# Patient Record
Sex: Male | Born: 1990 | Race: White | Hispanic: No | Marital: Single | Smoking: Current every day smoker
Health system: Southern US, Community
[De-identification: ages and names within clinical notes are randomized; demographics above are authoritative.]

## PROBLEM LIST (undated history)

## (undated) DIAGNOSIS — F1910 Other psychoactive substance abuse, uncomplicated: Secondary | ICD-10-CM

## (undated) HISTORY — PX: ROTATOR CUFF REPAIR: SHX139

---
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
## 2014-01-12 ENCOUNTER — Emergency Department: Payer: Self-pay | Admitting: Emergency Medicine

## 2014-02-18 ENCOUNTER — Emergency Department: Payer: Self-pay | Admitting: Emergency Medicine

## 2014-05-17 ENCOUNTER — Inpatient Hospital Stay
Admit: 2014-05-17 | Disposition: A | Discharge status: Home / Self Care | Payer: Self-pay | Attending: Internal Medicine | Admitting: Internal Medicine

## 2014-05-17 LAB — CBC WITH DIFFERENTIAL/PLATELET
BASOS PCT: 0.2 %
Basophil #: 0 10*3/uL (ref 0.0–0.1)
Eosinophil #: 0.1 10*3/uL (ref 0.0–0.7)
Eosinophil %: 0.5 %
HCT: 41.3 % (ref 40.0–52.0)
HGB: 13.5 g/dL (ref 13.0–18.0)
LYMPHS PCT: 7.7 %
Lymphocyte #: 1.3 10*3/uL (ref 1.0–3.6)
MCH: 27.3 pg (ref 26.0–34.0)
MCHC: 32.6 g/dL (ref 32.0–36.0)
MCV: 84 fL (ref 80–100)
MONOS PCT: 6.7 %
Monocyte #: 1.2 x10 3/mm — ABNORMAL HIGH (ref 0.2–1.0)
NEUTROS PCT: 84.9 %
Neutrophil #: 14.8 10*3/uL — ABNORMAL HIGH (ref 1.4–6.5)
Platelet: 292 10*3/uL (ref 150–440)
RBC: 4.94 10*6/uL (ref 4.40–5.90)
RDW: 12.4 % (ref 11.5–14.5)
WBC: 17.5 10*3/uL — AB (ref 3.8–10.6)

## 2014-05-17 LAB — BASIC METABOLIC PANEL
ANION GAP: 10 (ref 7–16)
BUN: 15 mg/dL
CALCIUM: 9.2 mg/dL
Chloride: 102 mmol/L
Co2: 26 mmol/L
Creatinine: 0.83 mg/dL
EGFR (African American): >60
Glucose: 93 mg/dL
Potassium: 4.2 mmol/L
SODIUM: 138 mmol/L

## 2014-05-18 LAB — BASIC METABOLIC PANEL
ANION GAP: 6 — AB (ref 7–16)
BUN: 13 mg/dL
CALCIUM: 8.5 mg/dL — AB
CHLORIDE: 103 mmol/L
Co2: 26 mmol/L
Creatinine: 0.79 mg/dL
EGFR (African American): >60
Glucose: 99 mg/dL
Potassium: 3.4 mmol/L — ABNORMAL LOW
Sodium: 135 mmol/L

## 2014-05-18 LAB — CBC WITH DIFFERENTIAL/PLATELET
BASOS ABS: 0 10*3/uL (ref 0.0–0.1)
Basophil %: 0.3 %
EOS PCT: 0.8 %
Eosinophil #: 0.1 10*3/uL (ref 0.0–0.7)
HCT: 36.3 % — ABNORMAL LOW (ref 40.0–52.0)
HGB: 12 g/dL — ABNORMAL LOW (ref 13.0–18.0)
LYMPHS ABS: 2.3 10*3/uL (ref 1.0–3.6)
Lymphocyte %: 17.5 %
MCH: 27.5 pg (ref 26.0–34.0)
MCHC: 33 g/dL (ref 32.0–36.0)
MCV: 83 fL (ref 80–100)
MONOS PCT: 10.2 %
Monocyte #: 1.3 x10 3/mm — ABNORMAL HIGH (ref 0.2–1.0)
NEUTROS PCT: 71.2 %
Neutrophil #: 9.4 10*3/uL — ABNORMAL HIGH (ref 1.4–6.5)
Platelet: 247 10*3/uL (ref 150–440)
RBC: 4.35 10*6/uL — AB (ref 4.40–5.90)
RDW: 12.7 % (ref 11.5–14.5)
WBC: 13.3 10*3/uL — AB (ref 3.8–10.6)

## 2014-05-19 LAB — CBC WITH DIFFERENTIAL/PLATELET
Basophil #: 0 10*3/uL (ref 0.0–0.1)
Basophil %: 0.5 %
EOS ABS: 0.3 10*3/uL (ref 0.0–0.7)
Eosinophil %: 3.5 %
HCT: 35.7 % — AB (ref 40.0–52.0)
HGB: 11.7 g/dL — ABNORMAL LOW (ref 13.0–18.0)
Lymphocyte #: 2.2 10*3/uL (ref 1.0–3.6)
Lymphocyte %: 25.4 %
MCH: 27.5 pg (ref 26.0–34.0)
MCHC: 32.8 g/dL (ref 32.0–36.0)
MCV: 84 fL (ref 80–100)
MONO ABS: 0.9 x10 3/mm (ref 0.2–1.0)
Monocyte %: 10.8 %
NEUTROS ABS: 5.2 10*3/uL (ref 1.4–6.5)
Neutrophil %: 59.8 %
Platelet: 251 10*3/uL (ref 150–440)
RBC: 4.25 10*6/uL — ABNORMAL LOW (ref 4.40–5.90)
RDW: 12.4 % (ref 11.5–14.5)
WBC: 8.7 10*3/uL (ref 3.8–10.6)

## 2014-05-19 LAB — VANCOMYCIN, TROUGH: VANCOMYCIN, TROUGH: 12 ug/mL

## 2014-05-22 LAB — CULTURE, BLOOD (SINGLE)

## 2014-05-24 NOTE — Op Note (Addendum)
PATIENT NAME:  Russell LemonsFOSTER, [place] MR#:  [id number] DATE OF BIRTH:  1990/03/31  DATE OF PROCEDURE:  05/17/2014  PREOPERATIVE DIAGNOSIS: Deep left antecubital abscess.   POSTOPERATIVE DIAGNOSIS: Deep left antecubital abscess.   OPERATION PERFORMED: Incision and drainage of deep left antecubital abscess.   ANESTHESIA: Local in the Emergency Department (2 mL of 1% lidocaine with epinephrine).   SURGEON: Duwaine MaxinWilliam Husam Hohn, M.D.   PROCEDURE IN DETAIL: The patient was prepped and draped in the usual sterile fashion, and a transverse incision was made just 5 mm proximal to the antecubital crease over the point of maximum abscess pointing, and this was carried down through the skin and subcutaneous tissue into the abscess cavity. Approximately 5 to 10 mL of pus was drained and then I packed the tip of a 2 x 2 gauze sponge deep into the deep space or deep compartment of the forearm and covered this with dry 4 x 4's and Kerlix sponge.   I instructed the patient and his mother as to the proper positioning during his hospitalization as well as went over incorrect positioning of his left upper extremity elevation. ____________________________ Claude MangesWilliam F. Sallye Lunz, MD wfm:sb D: 05/17/2014 19:49:58 ET T: 05/18/2014 07:30:32 ET JOB#: [id number]  cc: Claude MangesWilliam F. Sheron Tallman, MD, <Dictator> Claude MangesWILLIAM F Hutchinson Isenberg MD ELECTRONICALLY SIGNED 05/25/2014 7:56

## 2014-05-24 NOTE — Discharge Summary (Signed)
PATIENT NAME:  Lavella LemonsFOSTER, Russell Kennedy DATE OF BIRTH:  06/23/1990  DATE OF ADMISSION:  05/17/2014 DATE OF DISCHARGE:  05/20/2014  ADMISSION DIAGNOSIS:  Left arm pain, swelling, erythema.   DISCHARGE DIAGNOSES: 1. Left arm pain, swelling due to sepsis due to left antecubital abscess status post incision and drainage associated with cellulitis, now improved.  2. Hypokalemia, status post replacement.  3. Substance abuse. The patient counseled and information given to patient regarding outpatient treatment options.   CONSULTANTS:  Surgery, Quentin Orealph L. Ely, III, MD; Claude MangesWilliam F. Marterre, MD.  PROCEDURES:   Left deep antecubital abscess status post I and D.   PERTINENT LABS AND EVALUATIONS: Glucose 93, BUN 15, creatinine 0.83, sodium 138, potassium 4.2, chloride 102, CO2 was 26, calcium 9.2. WBC was 17.5,  extremity, left, showed heterogenous 2-3 cm mass in the antecubital fossa representing an abscess.   HOSPITAL COURSE: Please refer to H and P done by the admitting physician. The patient is a 24 year old white male who presented with erythema and swelling in the antecubital fossa. He was seen by surgery and he had an I and D. He was recommended by medicine to be admitted.  The patient was admitted to our service and kept in the hospital with antibiotics. The patient was slow to improve but now his erythema is mostly resolved. He was seen by surgery and cleared to be discharged. At this time, he is stable for discharge.   DISCHARGE MEDICATIONS: Augmentin 875/125 mg 1 tab p.o. q. 12 x6 days, Cleocin 300 mg 1 tab p.o. q. 6  x6 days, tramadol 50 q. 6 p.r.n. for pain.   DIET: Regular.   ACTIVITY: As tolerated.   [facility]  [facility]    Please also note, patient  to continue wound care with packing with iodoform every day and place the 2 x 2 gauze over the packing.    TIME SPENT: 35 minutes on this discharge.    ____________________________ Lacie ScottsShreyang H. Allena KatzPatel,  MD shp:tr D: 05/20/2014 14:07:37 ET T: 05/20/2014 16:53:52 ET JOB#: [id number]  cc: Roiza Wiedel H. Allena KatzPatel, MD, <Dictator> Charise CarwinSHREYANG H Demetri Kerman MD ELECTRONICALLY SIGNED 05/22/2014 14:43

## 2014-05-24 NOTE — H&P (Signed)
PATIENT NAME:  Russell Kennedy, Russell Kennedy MR#:  [id number] DATE OF BIRTH:  1990/10/26  DATE OF ADMISSION:  05/17/2014  REFERRING PHYSICIAN:  Su Leyobert L. Kinner, MD  PRIMARY CARE PHYSICIAN:  [facility].   CHIEF COMPLAINT:  Left arm pain.   HISTORY OF PRESENT ILLNESS:  A 24 year old Caucasian male without significant past medical history presenting for left arm pain and swelling.  Attempted IV drug use with heroin about 4 to 5 days ago.  Since that time has noted redness, erythema, edema, as well as pain in the left cubital fossa, as well as 1-day duration of subjective fevers, chills.  Described his pain as throbbing in quality, 7 to 8 out of 10 intensity, nonradiating, worse with movements, no alleviating factors, which has been progressively worsening since onset of symptoms.  He went to urgent care who recommended presenting to the hospital for further workup and evaluation.   REVIEW OF SYSTEMS:   CONSTITUTIONAL:  Positive for fevers and chills as stated above.  Denies any fatigue, weakness.  EYES:  Denies blurred vision, double vision, or eye pain.  EARS, NOSE, AND THROAT:  Denies tinnitus, ear pain, or hearing loss.  RESPIRATORY:  Denies cough, wheeze, shortness of breath.  CARDIOVASCULAR:  Denies chest pain, palpitations, edema.  GASTROINTESTINAL:  Denies nausea, vomiting, diarrhea, or abdominal pain.  GENITOURINARY:  Denies dysuria or hematuria.   ENDOCRINE:  Denies nocturia or thyroid problems. HEMATOLOGIC AND LYMPHATIC:  Denies easy bruising or bleeding.   SKIN:  Positive for erythematous area of left cubital fossa as stated above.  No further lesions or rashes.  MUSCULOSKELETAL:  Positive for pain in the left arm as stated above.  Denies pain in neck, back, shoulders, knees, hips, or arthritic symptoms.  NEUROLOGIC:  Denies paralysis, paresthesias.  PSYCHIATRIC:  Denies anxiety or depressive symptoms.   Otherwise full review of systems performed by me was negative.   PAST MEDICAL  HISTORY:  None.   SOCIAL HISTORY:  Positive for  tobacco use, occasional alcohol use, as well as recent IV drug use.  States he usually does not use IV drugs.   FAMILY HISTORY:  Denies any known cardiovascular or pulmonary disorders.   ALLERGIES:  No known drug allergies.   HOME MEDICATIONS:  None.   PHYSICAL EXAMINATION:  VITAL SIGNS:  Temperature 100.1 degrees Fahrenheit, heart rate 95, respirations 20, blood pressure 139/81, saturating 100% on room air.  Weight 115.7 kg, BMI 31.1.  GENERAL:  Well-nourished well-developed Caucasian male currently in no acute distress.  HEAD:  Normocephalic, atraumatic.  EYES:  Pupils equal, reactive to light.  Extraocular muscles intact.  No scleral icterus.  MOUTH:  Moist mucosal membranes.  Dentition intact.  No abscess noted.  EARS, NOSE, AND THROAT:  Clear without exudates.  No external lesions.  NECK:  Supple.  No thyromegaly.  No nodules.  No JVD.  PULMONARY:  Clear to auscultation bilaterally without wheezes, rales, or rhonchi.  No use of accessory muscles.  Good respiratory effort.   CHEST:  Nontender to palpation.  CARDIOVASCULAR:  S1, S2, regular rate and rhythm.  No murmurs, rubs, or gallops.  No edema.  Pedal pulse 2+ bilaterally.  GASTROINTESTINAL:  Soft, nontender, nondistended.  No masses.  Positive bowel sounds. No hepatosplenomegaly.   MUSCULOSKELETAL:  Left upper extremity cubital fossa currently dressed and immobilized after I and D.  Otherwise, no further appreciable swelling, clubbing, edema.  Range of motion limited in left upper arm given immobilization; otherwise, range of motion full.  NEUROLOGIC:  Cranial nerves II through XII intact.  No gross focal neurologic deficits. Sensation intact.  Reflexes intact.  SKIN:  His skin in the left upper extremity is now status post I and D performed by surgery, currently wound dressed and immobilized.  No further ulceration, lesions, rash, or cyanosis.  Skin warm, dry.  Turgor intact.   PSYCHIATRIC:  Mood and affect within normal limits.  Patient is awake, alert, oriented x 3.  Insight and judgment intact.   LABORATORY DATA:  Sodium of 138, potassium 4.2, chloride 102, bicarbonate 26, BUN 15, creatinine 0.83, glucose 93.  WBC 17.5, hemoglobin 13.5, platelets of 292,000.    Ultrasound revealed a 2 to 3 cm abscess.   ASSESSMENT AND PLAN:  A 24 year old Caucasian gentleman without significant past medical history presenting with left arm pain and swelling and found to have an abscess.  1.  Sepsis secondary to left antecubital abscess meeting septic criteria by heart rate, respiratory rate, and leukocytosis present on admission.  He is now status post incision and drainage performed in the Emergency Department by general surgery.  Followup culture data which has been obtained.  Initiated vancomycin for antibiotic coverage.  We will continue this for now.  Adjust with further culture data.  2.  Venous thromboembolism prophylaxis with sequential compression devices.   CODE STATUS:  Patient is full code.   TIME SPENT:  Thirty-five minutes.    ____________________________ Cletis Athens. Kimila Papaleo, MD dkh:kc D: 05/17/2014 21:02:00 ET T: 05/17/2014 22:14:05 ET JOB#: [id number]  cc: Cletis Athens. Tyteanna Ost, MD, <Dictator> Tsering Leaman Synetta Shadow MD ELECTRONICALLY SIGNED 05/20/2014 1:48

## 2015-05-15 ENCOUNTER — Inpatient Hospital Stay
Admission: EM | Admit: 2015-05-15 | Discharge: 2015-05-18 | DRG: 917 | Disposition: A | Discharge status: Home / Self Care | Payer: BLUE CROSS/BLUE SHIELD | Attending: Internal Medicine | Admitting: Internal Medicine

## 2015-05-15 DIAGNOSIS — F1410 Cocaine abuse, uncomplicated: Secondary | ICD-10-CM

## 2015-05-15 DIAGNOSIS — E876 Hypokalemia: Secondary | ICD-10-CM | POA: Diagnosis present

## 2015-05-15 DIAGNOSIS — F1010 Alcohol abuse, uncomplicated: Secondary | ICD-10-CM | POA: Diagnosis present

## 2015-05-15 DIAGNOSIS — F1310 Sedative, hypnotic or anxiolytic abuse, uncomplicated: Secondary | ICD-10-CM

## 2015-05-15 DIAGNOSIS — J9601 Acute respiratory failure with hypoxia: Secondary | ICD-10-CM | POA: Diagnosis present

## 2015-05-15 DIAGNOSIS — G92 Toxic encephalopathy: Secondary | ICD-10-CM | POA: Diagnosis present

## 2015-05-15 DIAGNOSIS — T405X1A Poisoning by cocaine, accidental (unintentional), initial encounter: Secondary | ICD-10-CM | POA: Diagnosis present

## 2015-05-15 DIAGNOSIS — J9600 Acute respiratory failure, unspecified whether with hypoxia or hypercapnia: Secondary | ICD-10-CM

## 2015-05-15 DIAGNOSIS — T401X1A Poisoning by heroin, accidental (unintentional), initial encounter: Secondary | ICD-10-CM | POA: Diagnosis not present

## 2015-05-15 DIAGNOSIS — I468 Cardiac arrest due to other underlying condition: Secondary | ICD-10-CM | POA: Diagnosis present

## 2015-05-15 DIAGNOSIS — T401X4A Poisoning by heroin, undetermined, initial encounter: Secondary | ICD-10-CM

## 2015-05-15 DIAGNOSIS — F1110 Opioid abuse, uncomplicated: Secondary | ICD-10-CM

## 2015-05-15 DIAGNOSIS — F17200 Nicotine dependence, unspecified, uncomplicated: Secondary | ICD-10-CM | POA: Diagnosis present

## 2015-05-15 DIAGNOSIS — F1020 Alcohol dependence, uncomplicated: Secondary | ICD-10-CM

## 2015-05-15 DIAGNOSIS — J690 Pneumonitis due to inhalation of food and vomit: Secondary | ICD-10-CM | POA: Diagnosis present

## 2015-05-15 HISTORY — DX: Other psychoactive substance abuse, uncomplicated: F19.10

## 2015-05-15 NOTE — ED Notes (Signed)
Per ems with heroin overdose. Pt awake to loud verbal stimulation. Pt received 6mg  narcan intranasally and 1mg  narcan iv with ems. Pt admits to heroin and etoh ingestion this pm.resps shallow, pt on nonrebreather at 15lpm with pox of 89%.

## 2015-05-16 ENCOUNTER — Inpatient Hospital Stay
Admit: 2015-05-16 | Discharge: 2015-05-16 | Disposition: A | Discharge status: Home / Self Care | Payer: BLUE CROSS/BLUE SHIELD | Attending: Adult Health | Admitting: Adult Health

## 2015-05-16 ENCOUNTER — Emergency Department: Payer: BLUE CROSS/BLUE SHIELD

## 2015-05-16 ENCOUNTER — Inpatient Hospital Stay: Payer: BLUE CROSS/BLUE SHIELD

## 2015-05-16 ENCOUNTER — Encounter: Payer: Self-pay | Admitting: Emergency Medicine

## 2015-05-16 DIAGNOSIS — F1310 Sedative, hypnotic or anxiolytic abuse, uncomplicated: Secondary | ICD-10-CM | POA: Diagnosis present

## 2015-05-16 DIAGNOSIS — F1110 Opioid abuse, uncomplicated: Secondary | ICD-10-CM | POA: Diagnosis present

## 2015-05-16 DIAGNOSIS — F1410 Cocaine abuse, uncomplicated: Secondary | ICD-10-CM

## 2015-05-16 DIAGNOSIS — J9601 Acute respiratory failure with hypoxia: Secondary | ICD-10-CM | POA: Diagnosis present

## 2015-05-16 DIAGNOSIS — J690 Pneumonitis due to inhalation of food and vomit: Secondary | ICD-10-CM | POA: Diagnosis present

## 2015-05-16 DIAGNOSIS — F17200 Nicotine dependence, unspecified, uncomplicated: Secondary | ICD-10-CM | POA: Diagnosis present

## 2015-05-16 DIAGNOSIS — T401X1A Poisoning by heroin, accidental (unintentional), initial encounter: Secondary | ICD-10-CM | POA: Diagnosis present

## 2015-05-16 DIAGNOSIS — T401X4S Poisoning by heroin, undetermined, sequela: Secondary | ICD-10-CM

## 2015-05-16 DIAGNOSIS — I468 Cardiac arrest due to other underlying condition: Secondary | ICD-10-CM | POA: Diagnosis present

## 2015-05-16 DIAGNOSIS — T405X1A Poisoning by cocaine, accidental (unintentional), initial encounter: Secondary | ICD-10-CM | POA: Diagnosis present

## 2015-05-16 DIAGNOSIS — E876 Hypokalemia: Secondary | ICD-10-CM | POA: Diagnosis present

## 2015-05-16 DIAGNOSIS — F1010 Alcohol abuse, uncomplicated: Secondary | ICD-10-CM | POA: Diagnosis present

## 2015-05-16 DIAGNOSIS — G92 Toxic encephalopathy: Secondary | ICD-10-CM | POA: Diagnosis present

## 2015-05-16 LAB — CBC WITH DIFFERENTIAL/PLATELET
BASOS ABS: 0.1 10*3/uL (ref 0–0.1)
Basophils Relative: 1 %
EOS PCT: 4 %
Eosinophils Absolute: 0.4 10*3/uL (ref 0–0.7)
HEMATOCRIT: 40.1 % (ref 40.0–52.0)
Hemoglobin: 13.5 g/dL (ref 13.0–18.0)
LYMPHS PCT: 24 %
Lymphs Abs: 2.4 10*3/uL (ref 1.0–3.6)
MCH: 27.9 pg (ref 26.0–34.0)
MCHC: 33.5 g/dL (ref 32.0–36.0)
MCV: 83.1 fL (ref 80.0–100.0)
Monocytes Absolute: 0.7 10*3/uL (ref 0.2–1.0)
Monocytes Relative: 7 %
Neutro Abs: 6.5 10*3/uL (ref 1.4–6.5)
Neutrophils Relative %: 64 %
PLATELETS: 284 10*3/uL (ref 150–440)
RBC: 4.83 MIL/uL (ref 4.40–5.90)
RDW: 12.9 % (ref 11.5–14.5)
WBC: 10.2 10*3/uL (ref 3.8–10.6)

## 2015-05-16 LAB — COMPREHENSIVE METABOLIC PANEL
ALK PHOS: 64 U/L (ref 38–126)
ALT: 91 U/L — ABNORMAL HIGH (ref 17–63)
AST: 126 U/L — ABNORMAL HIGH (ref 15–41)
Albumin: 4 g/dL (ref 3.5–5.0)
Anion gap: 13 (ref 5–15)
BUN: 11 mg/dL (ref 6–20)
CALCIUM: 8.4 mg/dL — AB (ref 8.9–10.3)
CHLORIDE: 105 mmol/L (ref 101–111)
CO2: 22 mmol/L (ref 22–32)
CREATININE: 1.25 mg/dL — AB (ref 0.61–1.24)
Glucose, Bld: 192 mg/dL — ABNORMAL HIGH (ref 65–99)
Potassium: 3.4 mmol/L — ABNORMAL LOW (ref 3.5–5.1)
Sodium: 140 mmol/L (ref 135–145)
Total Bilirubin: 0.3 mg/dL (ref 0.3–1.2)
Total Protein: 7.1 g/dL (ref 6.5–8.1)

## 2015-05-16 LAB — BLOOD GAS, ARTERIAL
ACID-BASE DEFICIT: 0.8 mmol/L (ref 0.0–2.0)
Acid-base deficit: 3.8 mmol/L — ABNORMAL HIGH (ref 0.0–2.0)
BICARBONATE: 23.1 meq/L (ref 21.0–28.0)
Bicarbonate: 25.4 mEq/L (ref 21.0–28.0)
FIO2: 1
FIO2: 1
MECHANICAL RATE: 18
MECHANICAL RATE: 24
MECHVT: 500 mL
O2 Saturation: 97.3 %
O2 Saturation: 99.9 %
PATIENT TEMPERATURE: 37
PATIENT TEMPERATURE: 37
PEEP/CPAP: 10 cmH2O
PEEP: 10 cmH2O
PH ART: 7.34 — AB (ref 7.350–7.450)
PO2 ART: 104 mmHg (ref 83.0–108.0)
PO2 ART: 275 mmHg — AB (ref 83.0–108.0)
VT: 500 mL
pCO2 arterial: 48 mmHg (ref 32.0–48.0)
pCO2 arterial: 47 mmHg (ref 32.0–48.0)
pH, Arterial: 7.29 — ABNORMAL LOW (ref 7.350–7.450)

## 2015-05-16 LAB — PHOSPHORUS: Phosphorus: 3.6 mg/dL (ref 2.5–4.6)

## 2015-05-16 LAB — ETHANOL: Alcohol, Ethyl (B): 54 mg/dL — ABNORMAL HIGH (ref <5)

## 2015-05-16 LAB — BASIC METABOLIC PANEL
Anion gap: 8 (ref 5–15)
BUN: 9 mg/dL (ref 6–20)
CO2: 25 mmol/L (ref 22–32)
Calcium: 7.9 mg/dL — ABNORMAL LOW (ref 8.9–10.3)
Chloride: 110 mmol/L (ref 101–111)
Creatinine, Ser: 0.91 mg/dL (ref 0.61–1.24)
Glucose, Bld: 100 mg/dL — ABNORMAL HIGH (ref 65–99)
POTASSIUM: 3.3 mmol/L — AB (ref 3.5–5.1)
SODIUM: 143 mmol/L (ref 135–145)

## 2015-05-16 LAB — CBC
HCT: 36.9 % — ABNORMAL LOW (ref 40.0–52.0)
HEMOGLOBIN: 12.5 g/dL — AB (ref 13.0–18.0)
MCH: 28.3 pg (ref 26.0–34.0)
MCHC: 33.9 g/dL (ref 32.0–36.0)
MCV: 83.5 fL (ref 80.0–100.0)
PLATELETS: 229 10*3/uL (ref 150–440)
RBC: 4.42 MIL/uL (ref 4.40–5.90)
RDW: 13 % (ref 11.5–14.5)
WBC: 9.7 10*3/uL (ref 3.8–10.6)

## 2015-05-16 LAB — SALICYLATE LEVEL

## 2015-05-16 LAB — URINE DRUG SCREEN, QUALITATIVE (ARMC ONLY)
Amphetamines, Ur Screen: NOT DETECTED
BARBITURATES, UR SCREEN: NOT DETECTED
Benzodiazepine, Ur Scrn: POSITIVE — AB
CANNABINOID 50 NG, UR ~~LOC~~: NOT DETECTED
Cocaine Metabolite,Ur ~~LOC~~: POSITIVE — AB
MDMA (Ecstasy)Ur Screen: NOT DETECTED
Methadone Scn, Ur: NOT DETECTED
Opiate, Ur Screen: POSITIVE — AB
Phencyclidine (PCP) Ur S: NOT DETECTED
TRICYCLIC, UR SCREEN: NOT DETECTED

## 2015-05-16 LAB — TROPONIN I
TROPONIN I: 0.12 ng/mL — AB (ref <0.031)
TROPONIN I: 0.06 ng/mL — AB (ref <0.031)

## 2015-05-16 LAB — MRSA PCR SCREENING: MRSA by PCR: NEGATIVE

## 2015-05-16 LAB — TRIGLYCERIDES: TRIGLYCERIDES: 257 mg/dL — AB (ref <150)

## 2015-05-16 LAB — ECHOCARDIOGRAM COMPLETE
HEIGHTINCHES: 76 in
WEIGHTICAEL: 4451.53 [oz_av]

## 2015-05-16 LAB — GLUCOSE, CAPILLARY: Glucose-Capillary: 90 mg/dL (ref 65–99)

## 2015-05-16 LAB — ACETAMINOPHEN LEVEL

## 2015-05-16 LAB — LACTIC ACID, PLASMA: LACTIC ACID, VENOUS: 1.5 mmol/L (ref 0.5–2.0)

## 2015-05-16 LAB — MAGNESIUM: Magnesium: 1.7 mg/dL (ref 1.7–2.4)

## 2015-05-16 MED ORDER — ALBUTEROL SULFATE (2.5 MG/3ML) 0.083% IN NEBU
2.5000 mg | INHALATION_SOLUTION | RESPIRATORY_TRACT | Status: DC
  Administered 2015-05-16 – 2015-05-17 (×9): 2.5 mg via RESPIRATORY_TRACT
  Filled 2015-05-16 (×9): qty 3

## 2015-05-16 MED ORDER — PROPOFOL 1000 MG/100ML IV EMUL
5.0000 ug/kg/min | Freq: Once | INTRAVENOUS | Status: AC
  Administered 2015-05-16: 80 ug/kg/min via INTRAVENOUS

## 2015-05-16 MED ORDER — VECURONIUM BROMIDE 10 MG IV SOLR
10.0000 mg | Freq: Once | INTRAVENOUS | Status: AC
  Administered 2015-05-16: 10 mg via INTRAVENOUS

## 2015-05-16 MED ORDER — MAGNESIUM SULFATE 2 GM/50ML IV SOLN
2.0000 g | Freq: Once | INTRAVENOUS | Status: AC
  Administered 2015-05-16: 2 g via INTRAVENOUS
  Filled 2015-05-16: qty 50

## 2015-05-16 MED ORDER — MIDAZOLAM HCL 2 MG/2ML IJ SOLN
2.0000 mg | INTRAMUSCULAR | Status: DC | PRN
  Filled 2015-05-16: qty 2

## 2015-05-16 MED ORDER — SODIUM CHLORIDE 0.9 % IV SOLN: 250.0000 mL | INTRAVENOUS | Status: DC | PRN

## 2015-05-16 MED ORDER — FENTANYL 2500MCG IN NS 250ML (10MCG/ML) PREMIX INFUSION
150.0000 ug/h | INTRAVENOUS | Status: DC
  Administered 2015-05-16: 150 ug/h via INTRAVENOUS

## 2015-05-16 MED ORDER — HEPARIN SODIUM (PORCINE) 5000 UNIT/ML IJ SOLN
5000.0000 [IU] | Freq: Three times a day (TID) | INTRAMUSCULAR | Status: DC
  Administered 2015-05-16 – 2015-05-17 (×5): 5000 [IU] via SUBCUTANEOUS
  Filled 2015-05-16 (×6): qty 1

## 2015-05-16 MED ORDER — BISACODYL 10 MG RE SUPP: 10.0000 mg | Freq: Every day | RECTAL | Status: DC | PRN

## 2015-05-16 MED ORDER — PROPOFOL 1000 MG/100ML IV EMUL
0.0000 ug/kg/min | INTRAVENOUS | Status: DC
  Administered 2015-05-16 (×3): 80 ug/kg/min via INTRAVENOUS
  Administered 2015-05-16 (×3): 70 ug/kg/min via INTRAVENOUS
  Administered 2015-05-16: 80 ug/kg/min via INTRAVENOUS
  Administered 2015-05-17 (×3): 70 ug/kg/min via INTRAVENOUS
  Filled 2015-05-16 (×14): qty 100

## 2015-05-16 MED ORDER — PANTOPRAZOLE SODIUM 40 MG IV SOLR
40.0000 mg | Freq: Every day | INTRAVENOUS | Status: DC
  Administered 2015-05-16 – 2015-05-17 (×2): 40 mg via INTRAVENOUS
  Filled 2015-05-16 (×2): qty 40

## 2015-05-16 MED ORDER — ETOMIDATE 2 MG/ML IV SOLN
20.0000 mg | Freq: Once | INTRAVENOUS | Status: AC
  Administered 2015-05-16: 20 mg via INTRAVENOUS

## 2015-05-16 MED ORDER — LACTATED RINGERS IV SOLN
INTRAVENOUS | Status: DC
  Administered 2015-05-16 – 2015-05-17 (×2): via INTRAVENOUS

## 2015-05-16 MED ORDER — FENTANYL BOLUS VIA INFUSION
100.0000 ug | INTRAVENOUS | Status: DC | PRN
  Filled 2015-05-16: qty 100

## 2015-05-16 MED ORDER — POTASSIUM CITRATE ER 10 MEQ (1080 MG) PO TBCR
60.0000 meq | EXTENDED_RELEASE_TABLET | Freq: Once | ORAL | Status: AC
  Administered 2015-05-16: 60 meq via ORAL
  Filled 2015-05-16: qty 6

## 2015-05-16 MED ORDER — FENTANYL 2500MCG IN NS 250ML (10MCG/ML) PREMIX INFUSION
25.0000 ug/h | INTRAVENOUS | Status: DC
  Administered 2015-05-16 – 2015-05-17 (×3): 300 ug/h via INTRAVENOUS
  Filled 2015-05-16 (×3): qty 250

## 2015-05-16 MED ORDER — ONDANSETRON HCL 4 MG/2ML IJ SOLN: 4.0000 mg | Freq: Four times a day (QID) | INTRAMUSCULAR | Status: DC | PRN

## 2015-05-16 MED ORDER — SUCCINYLCHOLINE CHLORIDE 20 MG/ML IJ SOLN
120.0000 mg | Freq: Once | INTRAMUSCULAR | Status: AC
  Administered 2015-05-16: 120 mg via INTRAVENOUS

## 2015-05-16 MED ORDER — PIPERACILLIN-TAZOBACTAM 3.375 G IVPB
3.3750 g | Freq: Three times a day (TID) | INTRAVENOUS | Status: DC
  Administered 2015-05-16 – 2015-05-18 (×7): 3.375 g via INTRAVENOUS
  Filled 2015-05-16 (×9): qty 50

## 2015-05-16 MED ORDER — SENNOSIDES 8.8 MG/5ML PO SYRP
5.0000 mL | ORAL_SOLUTION | Freq: Two times a day (BID) | ORAL | Status: DC | PRN
  Filled 2015-05-16: qty 5

## 2015-05-16 MED ORDER — MIDAZOLAM HCL 2 MG/2ML IJ SOLN: 2.0000 mg | INTRAMUSCULAR | Status: DC | PRN

## 2015-05-16 MED ORDER — CHLORHEXIDINE GLUCONATE 0.12% ORAL RINSE (MEDLINE KIT)
15.0000 mL | Freq: Two times a day (BID) | OROMUCOSAL | Status: DC
  Administered 2015-05-16 (×2): 15 mL via OROMUCOSAL
  Filled 2015-05-16 (×5): qty 15

## 2015-05-16 MED ORDER — ANTISEPTIC ORAL RINSE SOLUTION (CORINZ)
7.0000 mL | Freq: Four times a day (QID) | OROMUCOSAL | Status: DC
Start: 2015-05-16 — End: 2015-05-17
  Administered 2015-05-16 – 2015-05-17 (×4): 7 mL via OROMUCOSAL
  Filled 2015-05-16 (×8): qty 7

## 2015-05-16 NOTE — Progress Notes (Signed)
Initial Nutrition Assessment   INTERVENTION:   -Coordination of Care: Recommend initiation of EN within 24-48 hours of intubation as medically able. Will continue follow and make recommendations accordingly.    NUTRITION DIAGNOSIS:   Inadequate oral intake related to inability to eat as evidenced by NPO status.  GOAL:   Patient will meet greater than or equal to 90% of their needs  MONITOR:   Vent status, Labs, Weight trends, I & O's  REASON FOR ASSESSMENT:   Consult Assessment of nutrition requirement/status  ASSESSMENT:   Per MD note, 25 yo WM presenting with acute respiratory failure s/p CPR and narcan, potential aspiration pneumonitis, heroin overdose and polysubstance abuse; now intubated and sedated.  Past Medical History  Diagnosis Date  . Polysubstance abuse     Patient is currently intubated on ventilator support MV: 12.2 L/min Temp (24hrs), Avg:98.6 F (37 C), Min:97.9 F (36.6 C), Max:100 F (37.8 C)  Propofol: 62.5 ml/hr this am   Diet Order:  Diet NPO time specified  Skin:  Reviewed, no issues  Gastrointestinal Profile: OG tube documented in place Last BM:  no BM documented, hypoactive BS   Medications: Protonix, Fentanyl, L-R at 4075mL/hr, Propofol  Labs: reviewed, K 3.3, glucose 100   Nutrition-Focused Physical Exam Findings:  Unable to complete Nutrition-Focused physical exam at this time.    Weight Change:  Filed Weights   05/15/15 2351 05/16/15 0354  Weight: 287 lb (130.182 kg) 278 lb 3.5 oz (126.2 kg)    Height:   Ht Readings from Last 1 Encounters:  05/15/15 6\' 4"  (1.93 m)    Weight:   Wt Readings from Last 1 Encounters:  05/16/15 278 lb 3.5 oz (126.2 kg)    Ideal Body Weight:   91.8kg  BMI:  Body mass index is 33.88 kg/(m^2).  Estimated Nutritional Needs:   Kcal:  1386-1764kcals (11-14kcals/kg using current wt of 126kg per ASPEN guidelines)  Protein:  184-230g protein (2-2.5g/kg protein using IBW of  91.8kg)  Fluid:  >2.2L fluid  EDUCATION NEEDS:   No education needs identified at this time  Leda QuailAllyson Janal Haak, RD, LDN Pager [phone] Weekend/On-Call Pager [phone]

## 2015-05-16 NOTE — Progress Notes (Signed)
Pharmacy Antibiotic Note  Russell Kennedy is a 25 y.o. male admitted on 05/15/2015 with aspiration pneumonia.  Pharmacy has been consulted for Zosyn dosing.  Plan: Zosyn 3.375g IV q8h (4 hour infusion).  Height: 6\' 4"  (193 cm) Weight: 287 lb (130.182 kg) IBW/kg (Calculated) : 86.8  Temp (24hrs), Avg:97.9 F (36.6 C), Min:97.9 F (36.6 C), Max:97.9 F (36.6 C)   Recent Labs Lab 05/15/15 2358  WBC 10.2  CREATININE 1.25*    Estimated Creatinine Clearance: 134.3 mL/min (by C-G formula based on Cr of 1.25).    No Known Allergies  Antimicrobials this admission: Zosyn  >>    >>   Dose adjustments this admission:   Microbiology results: 4/24 BCx: pending 4/24 UCx: pending  4/24 Sputum: pending   4/24 CXR: perihilar infiltration  Thank you for allowing pharmacy to be a part of this patient's care.  Iretha Kirley S 05/16/2015 1:22 AM

## 2015-05-16 NOTE — ED Notes (Signed)
Patient given bolus dose of diprovan and increase rate to 8080mcg/kg/min. Is tolerating ET tube better.

## 2015-05-16 NOTE — Progress Notes (Signed)
Dr Belia HemanKasa notified of troponin 0.12

## 2015-05-16 NOTE — Plan of Care (Signed)
Problem: Education: Goal: Knowledge of [facility] Education information/materials will improve Outcome: Not Applicable Date Met:  38/17/71 Pt sedated. Family educated regarding meds and monitoring  Problem: Safety: Goal: Ability to remain free from injury will improve Outcome: Progressing No injury  Problem: Pain Managment: Goal: General experience of comfort will improve Outcome: Progressing CPOT zero on fentanyl at 348mg  Problem: Physical Regulation: Goal: Ability to maintain clinical measurements within normal limits will improve Outcome: Progressing Elevated temp managed by removing cover and cooling room.  DOES NOT require pressors. Goal: Will remain free from infection Outcome: Progressing WBC 9.7.  Temp max 100.  ETT secretions are creamy pink tinge. Sputum sent. On zosyn  Problem: Skin Integrity: Goal: Risk for impaired skin integrity will decrease Outcome: Progressing No breakdown. Mild sunburn to back. Continuous lateral rotation on. Heels elevated  Problem: Tissue Perfusion: Goal: Risk factors for ineffective tissue perfusion will decrease Outcome: Progressing SubQ heparin.  SCD's  Problem: Activity: Goal: Risk for activity intolerance will decrease Outcome: Not Progressing Bedrest. Agitation and bucking vent breaths/ coughing with turning   Problem: Fluid Volume: Goal: Ability to maintain a balanced intake and output will improve Outcome: Progressing 2700 urine output this shift.  K was replaced on night shift. No ectopy  Problem: Nutrition: Goal: Adequate nutrition will be maintained Outcome: Progressing Nutrition consult made. Tube feed will start tomorow if not extuated  Problem: Bowel/Gastric: Goal: Will not experience complications related to bowel motility Outcome: Progressing Hypoactive bowel sounds. No stools  Problem: Phase I Progression Outcomes Goal: GIProphysixis Outcome: Completed/Met Date Met:  05/16/15 protonix Goal: Code  status addressed with pt/family Outcome: Completed/Met Date Met:  05/16/15 Full Code

## 2015-05-16 NOTE — ED Provider Notes (Signed)
[facility] Emergency Department Provider Note  ____________________________________________  Time seen: Approximately 12:29 AM  I have reviewed the triage vital signs and the nursing notes.   HISTORY  Chief Complaint Drug Overdose  History limited by critical status due to heroin overdose  HPI Russell Kennedy is a 25 y.o. male with a prior history of drug abuse presents by EMS after reportedly being found down in full respiratory arrest after a heroin overdose.  The downtime is uncertain although it reportedly may have been as many as 20 minutes.  He was found unresponsive and bystanders started CPR.  When first responders arrived they gave him 6 mg of nasal Narcan which did not have an effect.  When the paramedics arrived they provided Narcan 1 mg by IV and it woke the patient up enough that he began breathing on his own and was able to mumble his name.  When he arrived he was on 15 L nonrebreather with a pulse ox between 86% and 89%, reading rapidly at approximately 45 times a minute, and lethargic but responsive to painful stimuli and loud voice.  By providing painful stimuli and asking him questions, he was able to answer some basic questions, including providing his name, asking about his wife, and admitting that he had snorted heroin prior to this episode and that he had had alcohol earlier in the day.  He indicated that the overdose was accidental and that he has not used heroin for months.  He is in moderate to severe respiratory distress with coarse breath sounds and tachypnea.  History reviewed. No pertinent past medical history.  Patient Active Problem List   Diagnosis Date Noted  . Heroin overdose 05/16/2015    History reviewed. No pertinent past surgical history.  No current outpatient prescriptions on file.  Allergies Review of patient's allergies indicates no known allergies.  History reviewed. No pertinent family history.  Social  History Unable to obtain due to acute heroin overdose and critical status - reported history of cocaine abuse   Review of Systems Unable to obtain due to critical status ____________________________________________   PHYSICAL EXAM:  VITAL SIGNS: ED Triage Vitals  Enc Vitals Group     BP 05/15/15 2351 136/77 mmHg     Pulse Rate 05/15/15 2351 108     Resp 05/15/15 2351 42     Temp 05/15/15 2351 97.9 F (36.6 C)     Temp Source 05/15/15 2351 Axillary     SpO2 05/15/15 2351 89 %     Weight 05/15/15 2351 287 lb (130.182 kg)     Height 05/15/15 2351  (1.93 m)     Head Cir --      Peak Flow --      Pain Score --      Pain Loc --      Pain Edu? --      Excl. in GC? --     Constitutional: Obtunded, tachypnea, moderate to severe respiratory distress with coarse breath sounds Eyes: Conjunctivae are normal. EOMI. Head: Atraumatic. Nose: No congestion/rhinnorhea. Mouth/Throat: Mucous membranes are moist.  Oropharynx non-erythematous. Neck: No stridor.  No meningeal signs.  No cervical spine tenderness to palpation and no palpable deformities Cardiovascular: Tachycardia, regular rhythm. Good peripheral circulation. Grossly normal heart sounds.   Respiratory: Increased respiratory effort.  Rest her rate is in the mid 40s.  Coarse breath sounds throughout, frequently clearing his throat and gurgling. Gastrointestinal: Soft and nontender. No distention.  Musculoskeletal: No lower extremity tenderness  nor edema. No gross deformities of extremities. Neurologic:  Slurred speech and language. No gross focal neurologic deficits are appreciated, moving all 4 extremities Skin:  Skin is warm, slightly diaphoretic and intact. No rash noted.   ____________________________________________   LABS (all labs ordered are listed, but only abnormal results are displayed)  Labs Reviewed  COMPREHENSIVE METABOLIC PANEL - Abnormal; Notable for the following:    Potassium 3.4 (*)    Glucose, Bld  192 (*)    Creatinine, Ser 1.25 (*)    Calcium 8.4 (*)    AST 126 (*)    ALT 91 (*)    All other components within normal limits  ETHANOL - Abnormal; Notable for the following:    Alcohol, Ethyl (B) 54 (*)    All other components within normal limits  URINE DRUG SCREEN, QUALITATIVE ([facility] ONLY) - Abnormal; Notable for the following:    Cocaine Metabolite,Ur [place] POSITIVE (*)    Opiate, Ur Screen POSITIVE (*)    Benzodiazepine, Ur Scrn POSITIVE (*)    All other components within normal limits  ACETAMINOPHEN LEVEL - Abnormal; Notable for the following:    Acetaminophen (Tylenol), Serum <10 (*)    All other components within normal limits  BLOOD GAS, ARTERIAL - Abnormal; Notable for the following:    pH, Arterial 7.29 (*)    Acid-base deficit 3.8 (*)    All other components within normal limits  CULTURE, BLOOD (ROUTINE X 2)  CULTURE, BLOOD (ROUTINE X 2)  URINE CULTURE  CULTURE, RESPIRATORY (NON-EXPECTORATED)  MRSA PCR SCREENING  CBC WITH DIFFERENTIAL/PLATELET  SALICYLATE LEVEL  TROPONIN I  TROPONIN I  TROPONIN I  LACTIC ACID, PLASMA  CBC  BASIC METABOLIC PANEL  BLOOD GAS, ARTERIAL  MAGNESIUM  PHOSPHORUS  TRIGLYCERIDES   ____________________________________________  EKG  ED ECG REPORT I, Andri Prestia, the attending physician, personally viewed and interpreted this ECG.  Date: 05/15/2015 EKG Time: 23:51 Rate: 104 Rhythm: Sinus tachycardia QRS Axis: normal Intervals: normal ST/T Wave abnormalities: normal Conduction Disturbances: none Narrative Interpretation: unremarkable  ____________________________________________  RADIOLOGY   Dg Chest 1 View  05/16/2015  CLINICAL DATA:  Possible heroin overdose. Altered mental status. CPR. EXAM: CHEST 1 VIEW COMPARISON:  None. FINDINGS: Endotracheal tube is been placed with tip measuring 4.7 cm above the carina. Enteric tube tip is off the field of view but below the left hemidiaphragm. Shallow inspiration with atelectasis  in the lung bases. Mild perihilar infiltration or edema. No blunting of costophrenic angles. No pneumothorax. Heart size and pulmonary vascularity are normal. IMPRESSION: Appliances appear in satisfactory location. Shallow inspiration with atelectasis in the lung bases and mild perihilar infiltration. Electronically Signed   By: Burman NievesWilliam  Stevens M.D.   On: 05/16/2015 01:00   Dg Abd 1 View  05/16/2015  CLINICAL DATA:  Enteric tube placement EXAM: ABDOMEN - 1 VIEW COMPARISON:  None. FINDINGS: Enteric tube tip is in the left upper quadrant consistent with location in the upper stomach. Gas fills the stomach. No abnormal distention. IMPRESSION: Enteric tube tip is in the left upper quadrant consistent with location in the upper stomach. Electronically Signed   By: Burman NievesWilliam  Stevens M.D.   On: 05/16/2015 01:01    ____________________________________________   PROCEDURES  Procedure(s) performed: intubation, see procedure note(s).  INTUBATION Performed by: Loleta RoseFORBACH, Amiel Sharrow  Required items: required blood products, implants, devices, and special equipment available Patient identity confirmed: provided demographic data and hospital-assigned identification number Time out: Immediately prior to procedure a "time out" was called to  verify the correct patient, procedure, equipment, support staff and site/side marked as required.  Indications: airway protection, oxygenation  Intubation method: Glidescope Laryngoscopy   Preoxygenation: NRB  Sedatives: Etomidate 20 mg IV Paralytic: Succinylcholine 120 mg IV  Tube Size: 8.0 cuffed  Post-procedure assessment: chest rise and ETCO2 monitor Breath sounds: equal and absent over the epigastrium Tube secured with: ETT holder Chest x-ray interpreted by radiologist and me.  Chest x-ray findings: endotracheal tube in appropriate position  Patient tolerated the procedure well with no immediate complications.     Critical Care performed: Yes, see critical  care note(s)  CRITICAL CARE Performed by: Loleta Rose   Total critical care time: 60 minutes  Critical care time was exclusive of separately billable procedures and treating other patients.  Critical care was necessary to treat or prevent imminent or life-threatening deterioration.  Critical care was time spent personally by me on the following activities: development of treatment plan with patient and/or surrogate as well as nursing, discussions with consultants, evaluation of patient's response to treatment, examination of patient, obtaining history from patient or surrogate, ordering and performing treatments and interventions, ordering and review of laboratory studies, ordering and review of radiographic studies, pulse oximetry and re-evaluation of patient's condition.  ____________________________________________   INITIAL IMPRESSION / ASSESSMENT AND PLAN / ED COURSE  Pertinent labs & imaging results that were available during my care of the patient were reviewed by me and considered in my medical decision making (see chart for details).  The patient arrived in moderate to severe respiratory distress, hypoxemic in spite of being on a nonrebreather.  I observed him for a time after his arrival to determine whether or not he needed more Narcan, and needed intubation, or just needed to be observed.  My concern was that his tachypnea persisted in the 40s and that his course, thick breath sounds, frequent clearing of throat, and obtundation, as well as his intermittent but persistent hypoxemia on a nonrebreather indicated imminent airway loss and his need for safe oxygenation and ventilation.  In spite of his abnormal vital signs and respiratory status he was obtunded but able to talk indicating that Narcan would not have fixed the issue.  After speaking briefly with the patient's father, I proceeded with emergent intubation to secure his airway and provide adequate oxygenation and  ventilation.  The RSI went smoothly; even though the patient did desat very quickly during a rapid single pass attempt (down to the upper 40s), he was bagged back up quickly to the 90s.  I asked the respiratory therapist increases PEEP given my concern for pulmonary edema or aspiration and he is currently on 100% FiO2 because initially he was on decreased FiO2 and was satting 88% while ventilated with a good waveform.  I updated the patient's family in detail and answered all of their questions.  I will admit to the ICU for further management.  (12:46 AM)  ----------------------------------------- 12:50 AM on 05/16/2015 -----------------------------------------  Spoke with ICU admitting provider.  Passed along all pertinent clinical information as well as the information from the police officers that the patient will have felony arrest warrants pending and they would like to be notified upon the patient's discharge from the ICU/hospital.  ----------------------------------------- 1:22 AM on 05/16/2015 -----------------------------------------  Patient is awaiting a bed upstairs.  Sedation has remained in issue and we have titrated up his propofol to 80 mcg/kg/h with the fentanyl at 200 g per hour.  I have also had to provide 2 additional boluses  of propofol to keep him sedated. ____________________________________________  FINAL CLINICAL IMPRESSION(S) / ED DIAGNOSES  Final diagnoses:  Acute respiratory failure, unspecified whether with hypoxia or hypercapnia (HCC)  Heroin overdose, undetermined intent, initial encounter  Cocaine abuse      NEW MEDICATIONS STARTED DURING THIS VISIT:  There are no discharge medications for this patient.     Note:  This document was prepared using Dragon voice recognition software and may include unintentional dictation errors.   Loleta Rose, MD 05/16/15 0230

## 2015-05-16 NOTE — ED Notes (Signed)
Patient here for drug overdose. Patient intubated due to respiratory decline. Family made aware. OG tube placed, foley catheter placed.

## 2015-05-16 NOTE — BH Assessment (Signed)
Per the request of ER charge nurse, TTS provided the family with literature and resources for support ([facility]).

## 2015-05-16 NOTE — ED Notes (Signed)
Patient continues to be restless. MD made aware. Diprovan increased.

## 2015-05-16 NOTE — H&P (Signed)
PULMONARY / CRITICAL CARE MEDICINE   Name: Russell Kennedy MRN: [id number] DOB: 1990/10/02    ADMISSION DATE:  05/15/2015   REFERRING MD:  ED  CHIEF COMPLAINT: Heroin overdose and respiratory arrest  HISTORY OF PRESENT ILLNESS:   25 yo Caucasian male with a PMH of polysubstance abuse  presenting via EMS following heroin overdose with cardiopulmonary arrest. History is obtained from ED/EMS records as patient is intubated and sedated. Patient was found unresponsive by bystanders and CPR initiated. [place]act downtown unknown. Upon EMS arrival, patient was found to be in complete respiratory arrest. He was given  of nasal narcan with no effect. Once IV access was secured, he was given 1 mg of IV narcan to which he responded. He regained consciousness and was able to breath on his own and attempt to verbally respond to EMS. He was placed on a non-rebreather mask and transported to the ED. At the ED, his O2 saturation was 86% and he was tachypneic with respiratory rate in the mid 40s. He was also coughing profusely. There was concern that he might have aspirated and may continue to aspirate hence he was emergently intubated. He is requiring high doses of sedation.  It is unclear if patient was actually pulseless when CPR was initiated.  In the ED, patient admitted to using heroin and alcohol prior to loosing consciousness. His toxicology screen was positive for benzos, cocaine and opiates and his blood alcohol level was 54.   POLICE ARE TO BE NOTIFIED ONCE PATIENT IS EXTUBATED AND CONSCIOUS.    PAST MEDICAL HISTORY :  He  has no past medical history on file.  PAST SURGICAL HISTORY: He  has no past surgical history on file.  No Known Allergies  No current facility-administered medications on file prior to encounter.   No current outpatient prescriptions on file prior to encounter.    FAMILY HISTORY:  His has no family status information on file.   SOCIAL HISTORY: He  reports that he has  been smoking.  He does not have any smokeless tobacco history on file. He reports that he drinks alcohol. He reports that he uses illicit drugs.  REVIEW OF SYSTEMS:   Unable to obtain as patient is intubated  SUBJECTIVE:   VITAL SIGNS: BP 149/86 mmHg  Pulse 109  Temp(Src) 97.9 F (36.6 C) (Axillary)  Resp 42  Ht  (1.93 m)  Wt 287 lb (130.182 kg)  BMI 34.95 kg/m2  SpO2 97%  HEMODYNAMICS:    VENTILATOR SETTINGS: Vent Mode:  [-] AC FiO2 (%):  [100 %] 100 % Set Rate:  [18 bmp] 18 bmp Vt Set:  [500 mL] 500 mL PEEP:  [10 cmH20] 10 cmH20  INTAKE / OUTPUT:    PHYSICAL EXAMINATION: General:  Well developed, well nourished, sedated Neuro: withdraws to pain; +gag reflex HEENT:  PERRLA, oral mucosa moist Cardiovascular: tachycardic, S1/S2, no MRG Lungs: Bilateral airflow, coarse rhonchi in anterior lung fields; no wheezing Abdomen: Soft, NT/ND, normal bowel sounds Musculoskeletal:  No visible deformities; +rom Extremities: +2 pulses, no edema Skin: warm and dry; no rash Gcs<8T  LABS:  BMET  Recent Labs Lab 05/15/15 2358  NA 140  K 3.4*  CL 105  CO2 22  BUN 11  CREATININE 1.25*  GLUCOSE 192*    Electrolytes  Recent Labs Lab 05/15/15 2358  CALCIUM 8.4*    CBC  Recent Labs Lab 05/15/15 2358  WBC 10.2  HGB 13.5  HCT 40.1  PLT 284    Coag's No  results for input(s): APTT, INR in the last 168 hours.  Sepsis Markers No results for input(s): LATICACIDVEN, PROCALCITON, O2SATVEN in the last 168 hours.  ABG No results for input(s): PHART, PCO2ART, PO2ART in the last 168 hours.  Liver Enzymes  Recent Labs Lab 05/15/15 2358  AST 126*  ALT 91*  ALKPHOS 64  BILITOT 0.3  ALBUMIN 4.0    Cardiac Enzymes No results for input(s): TROPONINI, PROBNP in the last 168 hours.  Glucose No results for input(s): GLUCAP in the last 168 hours.  Imaging Dg Chest 1 View  05/16/2015  CLINICAL DATA:  Possible heroin overdose. Altered mental status. CPR.  EXAM: CHEST 1 VIEW COMPARISON:  None. FINDINGS: Endotracheal tube is been placed with tip measuring 4.7 cm above the carina. Enteric tube tip is off the field of view but below the left hemidiaphragm. Shallow inspiration with atelectasis in the lung bases. Mild perihilar infiltration or edema. No blunting of costophrenic angles. No pneumothorax. Heart size and pulmonary vascularity are normal. IMPRESSION: Appliances appear in satisfactory location. Shallow inspiration with atelectasis in the lung bases and mild perihilar infiltration. Electronically Signed   By: Burman NievesWilliam  Stevens M.D.   On: 05/16/2015 01:00   Dg Abd 1 View  05/16/2015  CLINICAL DATA:  Enteric tube placement EXAM: ABDOMEN - 1 VIEW COMPARISON:  None. FINDINGS: Enteric tube tip is in the left upper quadrant consistent with location in the upper stomach. Gas fills the stomach. No abnormal distention. IMPRESSION: Enteric tube tip is in the left upper quadrant consistent with location in the upper stomach. Electronically Signed   By: Burman NievesWilliam  Stevens M.D.   On: 05/16/2015 01:01    STUDIES:  2-D echo ordered  CULTURES: 04/23 Blood x2 Urine Sputum  ANTIBIOTICS: Zosyn 04/22> Metronidazole 04/22>  SIGNIFICANT EVENTS: 05/15/15>ED with respiratory arrest 2/2 heroin overdose>intubated  LINES/TUBES: pivs ETT 04/22 Foley 04/22  DISCUSSION: 25 yo WM presenting with acute respiratory failure s/p CPR and narcan, potential aspiration pneumonitis, heroin overdose and polysubstance abuse; now intubated and sedated. Patient is at high risk for withdrawal.   ASSESSMENT / PLAN:  PULMONARY A: Acute hypoxemic respiratory failure 2/2 overdose Aspiration pneumonitis  Pulmonary edema P:   -Full vent support; settings reviewed -VAP protocol -prn albuterol -SBT and PSV trials as tolerated -Daily ABG and CXR  CARDIOVASCULAR A:  Questionable cardiac arrest-Uncertain if patient was actually pulseless when CPR was initiated Sinus  Tachycardia P:  -Hemodynamics per ICU -2-D echo -EKG -IV hydration  RENAL A:   Hypokalemia P:   -Monitor and replace electrolytes -Trend creatinine  INFECTIOUS A:   Aspiration pneumonitis P:   -Empiric antibiotics -F/U cultures  NEUROLOGIC A:   Metabolic encephalopathy 2/2 ETOH and heroin OD High risk for withdrawal 2/2 polysubstance abuse P:   RASS goal: -1 TO -2 -Propofol, fentanyl and versed for vent sedation -Neuro checks  Psyche A:   Polysubstance abuse P:   -Psychiatric consult  -Family wants patient committed to Police custody for possible linkage to resources once extubated. PLEASE NOTIFY POLICE ONCE PATIENT IS EXTUBATED.   Disposition and family update: family updated.  Best Practice: Code Status:  Full. Diet: NPO GI prophylaxis:  PPI. VTE prophylaxis:  SCD's / heparin.  Critical care time spent examining patient, establishing treatment plan, managing vent, reviewing history and labs, CXR, and ABG interpretation is 60 minutes  Magdalene S. [facility] ANP-BC Pulmonary and Critical Care Medicine [facility] Pager [phone]   05/16/2015, 1:22 AM   STAFF NOTE: I, Dr. Bynum BellowsKurian David  Senica Crall,  have personally reviewed patient's available data, including medical history, events of note, physical examination and test results as part of my evaluation. I have discussed with NP and other care providers such as pharmacist, RN and RRT.  In addition,  I personally evaluated patient and elicited key findings     A:acute resp failure from acute encephalopathy from acute ETOH and cocaine/heroin abuse  P:  vent support, IVF's, plan for extubation in next 24-48 hrs    The Rest per NP whose note is outlined above and that I agree with  I have personally reviewed/obtained a history, examined the patient, evaluated Pertinent laboratory and [employer]/imaging results, and  formulated the assessment and plan   The Patient requires high complexity decision  making for assessment and support, frequent evaluation and titration of therapies, application of advanced monitoring technologies and extensive interpretation of multiple databases. Critical Care Time devoted to patient care services described in this note is 60 minutes.  This Critical care time does not reflrect procedure time or supervisory time of NP but could involve care discussion time Overall, patient is critically ill, prognosis is guarded.     Lucie Leather, M.D.  [facility]  Medical Director [facility] Medical Director [facility] Cardio-Pulmonary Department

## 2015-05-17 ENCOUNTER — Inpatient Hospital Stay: Payer: BLUE CROSS/BLUE SHIELD

## 2015-05-17 DIAGNOSIS — F1020 Alcohol dependence, uncomplicated: Secondary | ICD-10-CM

## 2015-05-17 DIAGNOSIS — F1110 Opioid abuse, uncomplicated: Secondary | ICD-10-CM

## 2015-05-17 DIAGNOSIS — F1310 Sedative, hypnotic or anxiolytic abuse, uncomplicated: Secondary | ICD-10-CM

## 2015-05-17 DIAGNOSIS — T401X1A Poisoning by heroin, accidental (unintentional), initial encounter: Principal | ICD-10-CM

## 2015-05-17 LAB — BASIC METABOLIC PANEL
ANION GAP: 8 (ref 5–15)
BUN: 11 mg/dL (ref 6–20)
CALCIUM: 8.7 mg/dL — AB (ref 8.9–10.3)
CO2: 29 mmol/L (ref 22–32)
CREATININE: 1.09 mg/dL (ref 0.61–1.24)
Chloride: 105 mmol/L (ref 101–111)
Glucose, Bld: 116 mg/dL — ABNORMAL HIGH (ref 65–99)
Potassium: 4.2 mmol/L (ref 3.5–5.1)
SODIUM: 142 mmol/L (ref 135–145)

## 2015-05-17 LAB — CBC
HCT: 36.1 % — ABNORMAL LOW (ref 40.0–52.0)
HEMOGLOBIN: 11.9 g/dL — AB (ref 13.0–18.0)
MCH: 27.2 pg (ref 26.0–34.0)
MCHC: 33 g/dL (ref 32.0–36.0)
MCV: 82.4 fL (ref 80.0–100.0)
Platelets: 222 10*3/uL (ref 150–440)
RBC: 4.39 MIL/uL — ABNORMAL LOW (ref 4.40–5.90)
RDW: 13.1 % (ref 11.5–14.5)
WBC: 14.6 10*3/uL — AB (ref 3.8–10.6)

## 2015-05-17 LAB — GLUCOSE, CAPILLARY: Glucose-Capillary: 103 mg/dL — ABNORMAL HIGH (ref 65–99)

## 2015-05-17 LAB — MAGNESIUM: Magnesium: 1.9 mg/dL (ref 1.7–2.4)

## 2015-05-17 MED ORDER — ALBUTEROL SULFATE (2.5 MG/3ML) 0.083% IN NEBU: 2.5000 mg | INHALATION_SOLUTION | RESPIRATORY_TRACT | Status: DC | PRN

## 2015-05-17 MED ORDER — ANTISEPTIC ORAL RINSE SOLUTION (CORINZ)
7.0000 mL | Freq: Two times a day (BID) | OROMUCOSAL | Status: DC
  Filled 2015-05-17 (×3): qty 7

## 2015-05-17 MED ORDER — ACETAMINOPHEN 325 MG PO TABS
650.0000 mg | ORAL_TABLET | Freq: Four times a day (QID) | ORAL | Status: DC | PRN
  Administered 2015-05-17: 650 mg via ORAL
  Filled 2015-05-17: qty 2

## 2015-05-17 NOTE — Progress Notes (Signed)
Pharmacy Antibiotic Note  Russell Kennedy is a 25 y.o. male admitted on 05/15/2015 with aspiration pneumonia.  Pharmacy has been consulted for piperacillin/tazobactam dosing.  Patient is currently on day #2 of antibiotics  Plan: Continue piperacillin/tazobactam 3.375 g IV q8h EI  Height: 6\' 4"  (193 cm) Weight: 279 lb 12.2 oz (126.9 kg) IBW/kg (Calculated) : 86.8  Temp (24hrs), Avg:98.4 F (36.9 C), Min:97.8 F (36.6 C), Max:99.3 F (37.4 C)   Recent Labs Lab 05/15/15 2358 05/16/15 0229 05/16/15 0236 05/17/15 1027  WBC 10.2  --  9.7 14.6*  CREATININE 1.25*  --  0.91 1.09  LATICACIDVEN  --  1.5  --   --     Estimated Creatinine Clearance: 151.9 mL/min (by C-G formula based on Cr of 1.09).    No Known Allergies  Antimicrobials this admission: Piperacillin/tazobactam 4/23 >>   Dose adjustments this admission: n/a  Microbiology results: 4/23 BCx: no growth < 12 hours 4/22 UCx: No growth to date  4/23 Sputum: Pending  4/23 MRSA PCR: negative  Thank you for allowing pharmacy to be a part of this patient's care.  Cindi CarbonMary M Karstyn Birkey, PharmD Clinical Pharmacist 05/17/2015 1:54 PM

## 2015-05-17 NOTE — Consult Note (Signed)
[facility] Face-to-Face Psychiatry Consult   Reason for Consult:  Consult for this 25 year old man who came into the hospital with a near fatal overdose of what appears to be a combination of opiates and benzodiazepines. Concern about drug abuse. Referring Physician:  Vianne Bulls Patient Identification: Russell Kennedy MRN:  [id number] Principal Diagnosis: Heroin overdose Diagnosis:   Patient Active Problem List   Diagnosis Date Noted  . Opiate abuse, episodic [F11.10] 05/17/2015  . Benzodiazepine abuse [F13.10] 05/17/2015  . Alcohol abuse [F10.10] 05/17/2015  . Heroin overdose [T40.1X1A] 05/16/2015    Total Time spent with patient: 1 hour  Subjective:   Russell Kennedy is a 25 y.o. male patient admitted with "I don't think I'll ever do that again".  HPI:  Patient interviewed. At the request of his parents I also spoke with them before going in to see the patient. At that point I didn't have any information to share with them but they wanted to tell me their version of the story and their concerns. I also spoke to the parents again after interviewing the patient. Chart reviewed including old notes and encounters. Labs reviewed. Spoke with nursing. This 25 year old man came into the hospital after EMS found him turning blue and not breathing and being on the verge of death from a drug overdose. Patient was given multiple vials of Narcan and eventually required intubation. He tells me that he had gone to a wedding and that he took about 1-1/2 mg of Xanax, had a couple of beers, and then snorted some heroin. He says that he has decided in retrospect that snorting heroin is a bad idea and he will not do that again. He completely denies any thought about wanting to die or suicidal ideation. Denies depressed mood. He does describe himself as having "anxiety" but he is not able to really give me a very clear description of that. As far as his substance abuse the patient claims that this was the first time that he had  used heroin in probably a couple of weeks. Furthermore he claimed to me that he is only used heroin probably a total of 7 times ever in his life. He denies that he is injecting heroin. He admits that he uses Xanax as from time to time but also minimizes that. He says that he doesn't use any drugs at all most days. He says his alcohol use is usually only a couple of beers in the evening. He admits that he has had a drunk driving ticket in the past but says that he no longer ever drives after drinking. Patient ultimately says that in his opinion he doesn't think he has any kind of substance abuse problem. He simply sees this as an isolated incident that he can avoid in the future. Patient's parents have a very different take on things. They describe years of problems related to substance abuse. He was in the hospital about a year ago with a arm infection related to IV drug use. He's been to rehabilitation several times and never stays for the whole program. He has had DWIs and speeding tickets that were probably related to drug use. Mother reports finding IV needle scattered around his house frequently and that the patient routinely misses work. Parents are seeming to be at their wits and and are very upset about this and were requesting some kind of treatment be provided for him.  Substance abuse history: Patient admits that he abuses substances but as noted above he minimizes it greatly. Denies  any history of withdrawal. He denies that he injects himself with opiates. Says the only IV drug he ever used was cocaine. Says that he doesn't drink very much and has it under control. He doesn't feel like his substance use he is really in any way causing him regular problems. He has been to rehabilitation but never goes to outpatient substance abuse treatment.  Medical history: Patient is recovering now from a near fatal overdose of a combination of sedatives. They are keeping him in the intensive care unit I believe  overnight because his saturations are still not stable. At baseline he has no active medical problems.  Social history: Patient got married to his girlfriend last week and what sounds like a pretty impulsive decision. He has 2 young children one by his new wife and one by a previous girlfriend. He works doing Artist buildings. Parents are very concerned about him. Patient feels like the problem with his parents stems from his mother being overly concerned about him. Aircraft  Past Psychiatric History: No previous psychiatric inpatient treatment. No history of suicide attempts. He has as mentioned previously had some inpatient rehabilitation but doesn't sound like he is ever stayed for more than a few days. Apparently at some point in the past he saw a doctor who had prescribed him Klonopin and another who gave him Xanax. No really clear indication that he can describe other than being "anxious"  Risk to Self:   patient is not suicidal but obviously is at risk because of irresponsible behavior at times. Risk to Others:   risk to others primarily seems to just be if he is driving while abusing drugs Prior Inpatient Therapy:   rehabilitation but no inpatient psychiatric treatment Prior Outpatient Therapy:   no outpatient substance abuse treatment  Past Medical History:  Past Medical History  Diagnosis Date  . Polysubstance abuse    History reviewed. No pertinent past surgical history. Family History: History reviewed. No pertinent family history. Family Psychiatric  History: Patient denies or being any family history of mental health or substance abuse problems at all Social History:  History  Alcohol Use  . Yes     History  Drug Use  . Yes    Comment: heroin    Social History   Social History  . Marital Status: Single    Spouse Name: N/A  . Number of Children: N/A  . Years of Education: N/A   Social History Main Topics  . Smoking status: Current  Every Day Smoker  . Smokeless tobacco: None  . Alcohol Use: Yes  . Drug Use: Yes     Comment: heroin  . Sexual Activity: Not Asked   Other Topics Concern  . None   Social History Narrative   Additional Social History:    Allergies:  No Known Allergies  Labs:  Results for orders placed or performed during the hospital encounter of 05/15/15 (from the past 48 hour(s))  CBC with Differential     Status: None   Collection Time: 05/15/15 11:58 PM  Result Value Ref Range   WBC 10.2 3.8 - 10.6 K/uL   RBC 4.83 4.40 - 5.90 MIL/uL   Hemoglobin 13.5 13.0 - 18.0 g/dL   HCT 40.1 40.0 - 52.0 %   MCV 83.1 80.0 - 100.0 fL   MCH 27.9 26.0 - 34.0 pg   MCHC 33.5 32.0 - 36.0 g/dL   RDW 12.9 11.5 - 14.5 %   Platelets 284  150 - 440 K/uL   Neutrophils Relative % 64 %   Neutro Abs 6.5 1.4 - 6.5 K/uL   Lymphocytes Relative 24 %   Lymphs Abs 2.4 1.0 - 3.6 K/uL   Monocytes Relative 7 %   Monocytes Absolute 0.7 0.2 - 1.0 K/uL   Eosinophils Relative 4 %   Eosinophils Absolute 0.4 0 - 0.7 K/uL   Basophils Relative 1 %   Basophils Absolute 0.1 0 - 0.1 K/uL  Comprehensive metabolic panel     Status: Abnormal   Collection Time: 05/15/15 11:58 PM  Result Value Ref Range   Sodium 140 135 - 145 mmol/L   Potassium 3.4 (L) 3.5 - 5.1 mmol/L   Chloride 105 101 - 111 mmol/L   CO2 22 22 - 32 mmol/L   Glucose, Bld 192 (H) 65 - 99 mg/dL   BUN 11 6 - 20 mg/dL   Creatinine, Ser 1.25 (H) 0.61 - 1.24 mg/dL   Calcium 8.4 (L) 8.9 - 10.3 mg/dL   Total Protein 7.1 6.5 - 8.1 g/dL   Albumin 4.0 3.5 - 5.0 g/dL   AST 126 (H) 15 - 41 U/L   ALT 91 (H) 17 - 63 U/L   Alkaline Phosphatase 64 38 - 126 U/L   Total Bilirubin 0.3 0.3 - 1.2 mg/dL   GFR calc non Af Amer >60 >60 mL/min   GFR calc Af Amer >60 >60 mL/min    Comment: (NOTE) The eGFR has been calculated using the CKD EPI equation. This calculation has not been validated in all clinical situations. eGFR's persistently <60 mL/min signify possible Chronic  Kidney Disease.    Anion gap 13 5 - 15  Ethanol     Status: Abnormal   Collection Time: 05/15/15 11:58 PM  Result Value Ref Range   Alcohol, Ethyl (B) 54 (H) <5 mg/dL    Comment:        LOWEST DETECTABLE LIMIT FOR SERUM ALCOHOL IS 5 mg/dL FOR MEDICAL PURPOSES ONLY   Urine Drug Screen, Qualitative ([facility] only)     Status: Abnormal   Collection Time: 05/15/15 11:58 PM  Result Value Ref Range   Tricyclic, Ur Screen NONE DETECTED NONE DETECTED   Amphetamines, Ur Screen NONE DETECTED NONE DETECTED   MDMA (Ecstasy)Ur Screen NONE DETECTED NONE DETECTED   Cocaine Metabolite,Ur [place] POSITIVE (A) NONE DETECTED   Opiate, Ur Screen POSITIVE (A) NONE DETECTED   Phencyclidine (PCP) Ur S NONE DETECTED NONE DETECTED   Cannabinoid 50 Ng, Ur [place] NONE DETECTED NONE DETECTED   Barbiturates, Ur Screen NONE DETECTED NONE DETECTED   Benzodiazepine, Ur Scrn POSITIVE (A) NONE DETECTED   Methadone Scn, Ur NONE DETECTED NONE DETECTED    Comment: (NOTE) 119  Tricyclics, urine               Cutoff 1000 ng/mL 200  Amphetamines, urine             Cutoff 1000 ng/mL 300  MDMA (Ecstasy), urine           Cutoff 500 ng/mL 400  Cocaine Metabolite, urine       Cutoff 300 ng/mL 500  Opiate, urine                   Cutoff 300 ng/mL 600  Phencyclidine (PCP), urine      Cutoff 25 ng/mL 700  Cannabinoid, urine              Cutoff 50 ng/mL 800  Barbiturates, urine  Cutoff 200 ng/mL 900  Benzodiazepine, urine           Cutoff 200 ng/mL 1000 Methadone, urine                Cutoff 300 ng/mL 1100 1200 The urine drug screen provides only a preliminary, unconfirmed 1300 analytical test result and should not be used for non-medical 1400 purposes. Clinical consideration and professional judgment should 1500 be applied to any positive drug screen result due to possible 1600 interfering substances. A more specific alternate chemical method 1700 must be used in order to obtain a confirmed analytical result.  1800 Gas  chromato graphy / mass spectrometry (GC/MS) is the preferred 1900 confirmatory method.   Acetaminophen level     Status: Abnormal   Collection Time: 05/15/15 11:58 PM  Result Value Ref Range   Acetaminophen (Tylenol), Serum <10 (L) 10 - 30 ug/mL    Comment:        THERAPEUTIC CONCENTRATIONS VARY SIGNIFICANTLY. A RANGE OF 10-30 ug/mL MAY BE AN EFFECTIVE CONCENTRATION FOR MANY PATIENTS. HOWEVER, SOME ARE BEST TREATED AT CONCENTRATIONS OUTSIDE THIS RANGE. ACETAMINOPHEN CONCENTRATIONS >150 ug/mL AT 4 HOURS AFTER INGESTION AND >50 ug/mL AT 12 HOURS AFTER INGESTION ARE OFTEN ASSOCIATED WITH TOXIC REACTIONS.   Salicylate level     Status: None   Collection Time: 05/15/15 11:58 PM  Result Value Ref Range   Salicylate Lvl <3.8 2.8 - 30.0 mg/dL  Troponin I     Status: None   Collection Time: 05/15/15 11:58 PM  Result Value Ref Range   Troponin I <0.03 <0.031 ng/mL    Comment:        NO INDICATION OF MYOCARDIAL INJURY.   Urine culture     Status: None (Preliminary result)   Collection Time: 05/15/15 11:58 PM  Result Value Ref Range   Specimen Description URINE, RANDOM    Special Requests NONE    Culture NO GROWTH 1 DAY    Report Status PENDING   Triglycerides     Status: Abnormal   Collection Time: 05/15/15 11:58 PM  Result Value Ref Range   Triglycerides 257 (H) <150 mg/dL  Blood gas, arterial     Status: Abnormal   Collection Time: 05/16/15  1:19 AM  Result Value Ref Range   FIO2 1.00    Delivery systems VENTILATOR    Mode ASSIST CONTROL    VT 500 mL   Peep/cpap 10.0 cm H20   pH, Arterial 7.29 (L) 7.350 - 7.450   pCO2 arterial 48 32.0 - 48.0 mmHg   pO2, Arterial 104 83.0 - 108.0 mmHg   Bicarbonate 23.1 21.0 - 28.0 mEq/L   Acid-base deficit 3.8 (H) 0.0 - 2.0 mmol/L   O2 Saturation 97.3 %   Patient temperature 37.0    Collection site RIGHT RADIAL    Sample type ARTERIAL DRAW    Allens test (pass/fail) PASS PASS   Mechanical Rate 18   Glucose, capillary      Status: Abnormal   Collection Time: 05/16/15  2:09 AM  Result Value Ref Range   Glucose-Capillary 103 (H) 65 - 99 mg/dL  Culture, blood (routine x 2)     Status: None (Preliminary result)   Collection Time: 05/16/15  2:22 AM  Result Value Ref Range   Specimen Description BLOOD LEFT HAND    Special Requests      BOTTLES DRAWN AEROBIC AND ANAEROBIC 10CCAERO,10CCANA   Culture NO GROWTH < 12 HOURS    Report Status PENDING  MRSA PCR Screening     Status: None   Collection Time: 05/16/15  2:22 AM  Result Value Ref Range   MRSA by PCR NEGATIVE NEGATIVE    Comment:        The GeneXpert MRSA Assay (FDA approved for NASAL specimens only), is one component of a comprehensive MRSA colonization surveillance program. It is not intended to diagnose MRSA infection nor to guide or monitor treatment for MRSA infections.   Lactic acid, plasma     Status: None   Collection Time: 05/16/15  2:29 AM  Result Value Ref Range   Lactic Acid, Venous 1.5 0.5 - 2.0 mmol/L  Culture, blood (routine x 2)     Status: None (Preliminary result)   Collection Time: 05/16/15  2:29 AM  Result Value Ref Range   Specimen Description BLOOD LEFT ARM    Special Requests      BOTTLES DRAWN AEROBIC AND ANAEROBIC 10CCAERO,10CCANA   Culture NO GROWTH < 12 HOURS    Report Status PENDING   CBC     Status: Abnormal   Collection Time: 05/16/15  2:36 AM  Result Value Ref Range   WBC 9.7 3.8 - 10.6 K/uL   RBC 4.42 4.40 - 5.90 MIL/uL   Hemoglobin 12.5 (L) 13.0 - 18.0 g/dL   HCT 36.9 (L) 40.0 - 52.0 %   MCV 83.5 80.0 - 100.0 fL   MCH 28.3 26.0 - 34.0 pg   MCHC 33.9 32.0 - 36.0 g/dL   RDW 13.0 11.5 - 14.5 %   Platelets 229 150 - 440 K/uL  Basic metabolic panel     Status: Abnormal   Collection Time: 05/16/15  2:36 AM  Result Value Ref Range   Sodium 143 135 - 145 mmol/L   Potassium 3.3 (L) 3.5 - 5.1 mmol/L   Chloride 110 101 - 111 mmol/L   CO2 25 22 - 32 mmol/L   Glucose, Bld 100 (H) 65 - 99 mg/dL   BUN 9 6 - 20  mg/dL   Creatinine, Ser 0.91 0.61 - 1.24 mg/dL   Calcium 7.9 (L) 8.9 - 10.3 mg/dL   GFR calc non Af Amer >60 >60 mL/min   GFR calc Af Amer >60 >60 mL/min    Comment: (NOTE) The eGFR has been calculated using the CKD EPI equation. This calculation has not been validated in all clinical situations. eGFR's persistently <60 mL/min signify possible Chronic Kidney Disease.    Anion gap 8 5 - 15  Magnesium     Status: None   Collection Time: 05/16/15  2:36 AM  Result Value Ref Range   Magnesium 1.7 1.7 - 2.4 mg/dL  Phosphorus     Status: None   Collection Time: 05/16/15  2:36 AM  Result Value Ref Range   Phosphorus 3.6 2.5 - 4.6 mg/dL  Blood gas, arterial     Status: Abnormal   Collection Time: 05/16/15  5:00 AM  Result Value Ref Range   FIO2 1.00    Delivery systems VENTILATOR    Mode PRESSURE REGULATED VOLUME CONTROL    VT 500 mL   Peep/cpap 10.0 cm H20   pH, Arterial 7.34 (L) 7.350 - 7.450   pCO2 arterial 47 32.0 - 48.0 mmHg   pO2, Arterial 275 (H) 83.0 - 108.0 mmHg   Bicarbonate 25.4 21.0 - 28.0 mEq/L   Acid-base deficit 0.8 0.0 - 2.0 mmol/L   O2 Saturation 99.9 %   Patient temperature 37.0    Collection site RIGHT RADIAL  Sample type ARTERIAL DRAW    Allens test (pass/fail) PASS PASS   Mechanical Rate 24   Troponin I     Status: Abnormal   Collection Time: 05/16/15  6:51 AM  Result Value Ref Range   Troponin I 0.12 (H) <0.031 ng/mL    Comment: READ BACK AND VERIFIED WITH PAM MYERS ON 05/16/15 AT [address]        PERSISTENTLY INCREASED TROPONIN VALUES IN THE RANGE OF 0.04-0.49 ng/mL CAN BE SEEN IN:       -UNSTABLE ANGINA       -CONGESTIVE HEART FAILURE       -MYOCARDITIS       -CHEST TRAUMA       -ARRYHTHMIAS       -LATE PRESENTING MYOCARDIAL INFARCTION       -COPD   CLINICAL FOLLOW-UP RECOMMENDED.   Troponin I     Status: Abnormal   Collection Time: 05/16/15  1:06 PM  Result Value Ref Range   Troponin I 0.06 (H) <0.031 ng/mL    Comment: READ BACK AND  VERIFIED WITH PAM MYERS ON 05/16/15 AT 1347 [facility]        PERSISTENTLY INCREASED TROPONIN VALUES IN THE RANGE OF 0.04-0.49 ng/mL CAN BE SEEN IN:       -UNSTABLE ANGINA       -CONGESTIVE HEART FAILURE       -MYOCARDITIS       -CHEST TRAUMA       -ARRYHTHMIAS       -LATE PRESENTING MYOCARDIAL INFARCTION       -COPD   CLINICAL FOLLOW-UP RECOMMENDED.   Culture, respiratory (tracheal aspirate)     Status: None (Preliminary result)   Collection Time: 05/16/15  5:04 PM  Result Value Ref Range   Specimen Description TRACHEAL ASPIRATE    Special Requests NONE    Gram Stain      FEW WBC SEEN MODERATE GRAM POSITIVE COCCI IN CHAINS RARE GRAM NEGATIVE RODS    Culture CULTURE REINCUBATED FOR BETTER GROWTH    Report Status PENDING   Glucose, capillary     Status: None   Collection Time: 05/16/15  6:33 PM  Result Value Ref Range   Glucose-Capillary 90 65 - 99 mg/dL  CBC     Status: Abnormal   Collection Time: 05/17/15 10:27 AM  Result Value Ref Range   WBC 14.6 (H) 3.8 - 10.6 K/uL   RBC 4.39 (L) 4.40 - 5.90 MIL/uL   Hemoglobin 11.9 (L) 13.0 - 18.0 g/dL   HCT 36.1 (L) 40.0 - 52.0 %   MCV 82.4 80.0 - 100.0 fL   MCH 27.2 26.0 - 34.0 pg   MCHC 33.0 32.0 - 36.0 g/dL   RDW 13.1 11.5 - 14.5 %   Platelets 222 150 - 440 K/uL  Basic metabolic panel     Status: Abnormal   Collection Time: 05/17/15 10:27 AM  Result Value Ref Range   Sodium 142 135 - 145 mmol/L   Potassium 4.2 3.5 - 5.1 mmol/L   Chloride 105 101 - 111 mmol/L   CO2 29 22 - 32 mmol/L   Glucose, Bld 116 (H) 65 - 99 mg/dL   BUN 11 6 - 20 mg/dL   Creatinine, Ser 1.09 0.61 - 1.24 mg/dL   Calcium 8.7 (L) 8.9 - 10.3 mg/dL   GFR calc non Af Amer >60 >60 mL/min   GFR calc Af Amer >60 >60 mL/min    Comment: (NOTE) The eGFR has been calculated using  the CKD EPI equation. This calculation has not been validated in all clinical situations. eGFR's persistently <60 mL/min signify possible Chronic Kidney Disease.    Anion gap 8 5 - 15   Magnesium     Status: None   Collection Time: 05/17/15 10:27 AM  Result Value Ref Range   Magnesium 1.9 1.7 - 2.4 mg/dL    Current Facility-Administered Medications  Medication Dose Route Frequency Provider Last Rate Last Dose  . 0.9 %  sodium chloride infusion  250 mL Intravenous PRN Mikael Spray, NP      . acetaminophen (TYLENOL) tablet 650 mg  650 mg Oral Q6H PRN Laverle Hobby, MD   650 mg at 05/17/15 1350  . albuterol (PROVENTIL) (2.5 MG/3ML) 0.083% nebulizer solution 2.5 mg  2.5 mg Nebulization Q4H PRN Epifanio Lesches, MD      . antiseptic oral rinse solution (CORINZ)  7 mL Mouth Rinse BID Laverle Hobby, MD      . bisacodyl (DULCOLAX) suppository 10 mg  10 mg Rectal Daily PRN Mikael Spray, NP      . heparin injection 5,000 Units  5,000 Units Subcutaneous Q8H Mikael Spray, NP   5,000 Units at 05/17/15 1345  . ondansetron (ZOFRAN) injection 4 mg  4 mg Intravenous Q6H PRN Mikael Spray, NP      . pantoprazole (PROTONIX) injection 40 mg  40 mg Intravenous QHS Mikael Spray, NP   40 mg at 05/16/15 2132  . piperacillin-tazobactam (ZOSYN) IVPB 3.375 g  3.375 g Intravenous Q8H Mikael Spray, NP   3.375 g at 05/17/15 1143  . sennosides (SENOKOT) 8.8 MG/5ML syrup 5 mL  5 mL Per Tube BID PRN Mikael Spray, NP        Musculoskeletal: Strength & Muscle Tone: within normal limits Gait & Station: normal Patient leans: N/A  Psychiatric Specialty Exam: Review of Systems  Constitutional: Negative.   HENT: Negative.   Eyes: Negative.   Respiratory: Negative.   Cardiovascular: Negative.   Gastrointestinal: Negative.   Musculoskeletal: Positive for myalgias.  Skin: Negative.   Neurological: Negative.   Psychiatric/Behavioral: Positive for substance abuse. Negative for depression, suicidal ideas, hallucinations and memory loss. The patient is nervous/anxious. The patient does not have insomnia.     Blood pressure 126/63, pulse 100,  temperature 97.8 F (36.6 C), temperature source Oral, resp. rate 22, height _0  (1.93 m), weight 126.9 kg (279 lb 12.2 oz), SpO2 93 %.Body mass index is 34.07 kg/(m^2).  General Appearance: Fairly Groomed  Engineer, water::  Fair  Speech:  Normal Rate  Volume:  Normal  Mood:  Anxious  Affect:  Appropriate  Thought Process:  Intact  Orientation:  Full (Time, Place, and Person)  Thought Content:  Negative  Suicidal Thoughts:  No  Homicidal Thoughts:  No  Memory:  Immediate;   Good Recent;   Good Remote;   Fair  Judgement:  Impaired  Insight:  Shallow  Psychomotor Activity:  Normal  Concentration:  Fair  Recall:  [employer] of Knowledge:Fair  Language: Fair  Akathisia:  No  Handed:  Right  AIMS (if indicated):     Assets:  Communication Skills Housing Physical Health Resilience Social Support  ADL's:  Intact  Cognition: WNL  Sleep:      Treatment Plan Summary: Plan 25 year old man who came to the hospital after almost dying from an overdose of opiates and benzodiazepines. Patient is minimizing his problem and when pushed ultimately states that he does not  think that drug abuse is a problem for him. Doesn't feel like he really needs to make any particular changes to his life. Doesn't really see any benefit in getting any kind of substance abuse treatment. There is no sign of depression and psychosis or other treatable Axis I mental disorder. The patient is of normal intelligence and able to make his own decisions. As I explained to the parents really he needs to come to his own understanding of how substance abuse has been a problem for him. There is no indication for involuntary commitment. Unlikely to benefit in this stage from going to any kind of rehabilitation. I tried to educate the patient about the availability of outpatient substance abuse facilities in the community. Also gave the parents some information about it. I will ask social work in follow-up to just make sure he has  information about substance abuse treatment. Of course educated the patient about how he came very close to dying if 911 hadn't intervened in this condition. No medication indicated.  Disposition: Patient does not meet criteria for psychiatric inpatient admission. Supportive therapy provided about ongoing stressors. Discussed crisis plan, support from social network, calling 911, coming to the Emergency Department, and calling Suicide Hotline.  Alethia Berthold, MD 05/17/2015 5:06 PM

## 2015-05-17 NOTE — Progress Notes (Signed)
Pt extubated this am currently on 4L O2 via nasal canula with O2 sats upper 90's; vss; nsr on cardiac monitor;  adequate uop via urinal post foley removal; c/o headache placed PRN tylenol orders per Dr. Laurence Alyam's verbal orders; discontinued all ventilator and sedation orders post extubation per Dr Ardyth Manam; Psych consult placed per Dr. Laurence Alyam's verbal orders due to heroin overdose; LR discontinued pt tolerating diet; pt currently stepdown per Dr. Laurence Alyam's verbal orders; pts family updated about plan of care and questions answered will continue to monitor and assess pt

## 2015-05-17 NOTE — Progress Notes (Signed)
Notified [place] [employer] pt extubated and alert and oriented

## 2015-05-17 NOTE — Progress Notes (Signed)
Placed on high fowlers position, cuff deflated suctioned orally and endotracheally and then extubated to 2 lpm O2 [place] 

## 2015-05-17 NOTE — Progress Notes (Signed)
Patient remains intubated/sedated overnight. Remains on high fentanyl and Propofol gtt with adequate sedation. VSS, mother at bedside overnight given continual update on patient status. See [facility] for further update.

## 2015-05-17 NOTE — Progress Notes (Signed)
[facility] Critical [facility] Medicine Progess Note    ASSESSMENT/PLAN     PULMONARY A: Acute hypoxemic respiratory failure 2/2 overdose Aspiration pneumonitis  Pulmonary edema P:  -Current vent settings. The PRVC/24/500/10/30%. -Respiratory status appears improved today, chest x-ray from this morning was reviewed, unremarkable, minimal change from previous, ET tube in slightly elevated position. -We will check a weaning trial today, extubated, tolerated.  CARDIOVASCULAR A:  Questionable cardiac arrest-Uncertain if patient was actually pulseless when CPR was initiated Sinus Tachycardia P:  -Hemodynamics per ICU -2-D echo results reviewed, EF 55%, unremarkable. -IV hydration  RENAL A:  Hypokalemia P:  -Monitor and replace electrolytes. Repeat electrolytes pending. -Trend creatinine  INFECTIOUS A:  Aspiration pneumonitis P:  -Empiric antibiotics, currently on Zosyn can de-escalate as needed. -F/U cultures  Sputum culture 4/23: Negative Blood culture 4/23: Negative Urine culture 4/22: Pending. MRSA screen 4./23: Negative.  NEUROLOGIC A:  Metabolic encephalopathy 2/2 ETOH and heroin OD High risk for withdrawal 2/2 polysubstance abuse P:  RASS goal: -1 TO -2 -Propofol, fentanyl and versed for vent sedation -Neuro checks  Psyche A:  Polysubstance abuse P:  -Psychiatric consult  -Family wants patient committed to Police custody for possible linkage to resources once extubated. WILL NOTIFY POLICE ONCE PATIENT IS EXTUBATED.    ---------------------------------------   ----------------------------------------   Name: Russell Kennedy MRN: [id number] DOB: 1990/11/25    ADMISSION DATE:  05/15/2015   SUBJECTIVE:   Pt currently on the ventilator, can not provide history or review of systems.    VITAL SIGNS: Temp:  [97.9 F (36.6 C)-100 F (37.8 C)] 98.3 F (36.8 C) (04/24 0800) Pulse Rate:  [79-107] 107 (04/24 0800) Resp:   [12-24] 16 (04/24 0800) BP: (97-145)/(59-88) 115/65 mmHg (04/24 0700) SpO2:  [95 %-100 %] 99 % (04/24 0800) FiO2 (%):  [30 %] 30 % (04/24 0053) Weight:  [279 lb 12.2 oz (126.9 kg)] 279 lb 12.2 oz (126.9 kg) (04/24 0527) HEMODYNAMICS:   VENTILATOR SETTINGS: Vent Mode:  [-] PRVC FiO2 (%):  [30 %] 30 % Set Rate:  [24 bmp] 24 bmp Vt Set:  [500 mL] 500 mL PEEP:  [10 cmH20] 10 cmH20 Plateau Pressure:  [20 cmH20] 20 cmH20 INTAKE / OUTPUT:  Intake/Output Summary (Last 24 hours) at 05/17/15 0920 Last data filed at 05/17/15 0600  Gross per 24 hour  Intake 3613.7 ml  Output   3045 ml  Net  568.7 ml    PHYSICAL EXAMINATION: Physical Examination:   VS: BP 115/65 mmHg  Pulse 107  Temp(Src) 98.3 F (36.8 C) (Oral)  Resp 16  Ht 6\' 4"  (1.93 m)  Wt 279 lb 12.2 oz (126.9 kg)  BMI 34.07 kg/m2  SpO2 99%  General Appearance: No distress  Neuro:without focal findings, mental status normal. HEENT: PERRLA, EOM intact. Pulmonary: normal breath sounds   CardiovascularNormal S1,S2.  No m/r/g.   Abdomen: Benign, Soft, non-tender. Renal:  No costovertebral tenderness  GU:  Not performed at this time. Endocrine: No evident thyromegaly. Skin:   warm, no rashes, no ecchymosis  Extremities: normal, no cyanosis, clubbing.   LABS:   LABORATORY PANEL:   CBC  Recent Labs Lab 05/16/15 0236  WBC 9.7  HGB 12.5*  HCT 36.9*  PLT 229    Chemistries   Recent Labs Lab 05/15/15 2358 05/16/15 0236  NA 140 143  K 3.4* 3.3*  CL 105 110  CO2 22 25  GLUCOSE 192* 100*  BUN 11 9  CREATININE 1.25* 0.91  CALCIUM 8.4* 7.9*  MG  --  1.7  PHOS  --  3.6  AST 126*  --   ALT 91*  --   ALKPHOS 64  --   BILITOT 0.3  --      Recent Labs Lab 05/16/15 1833  GLUCAP 90    Recent Labs Lab 05/16/15 0119 05/16/15 0500  PHART 7.29* 7.34*  PCO2ART 48 47  PO2ART 104 275*    Recent Labs Lab 05/15/15 2358  AST 126*  ALT 91*  ALKPHOS 64  BILITOT 0.3  ALBUMIN 4.0    Cardiac  Enzymes  Recent Labs Lab 05/16/15 1306  TROPONINI 0.06*    RADIOLOGY:  Dg Chest 1 View  05/16/2015  CLINICAL DATA:  Possible heroin overdose. Altered mental status. CPR. EXAM: CHEST 1 VIEW COMPARISON:  None. FINDINGS: Endotracheal tube is been placed with tip measuring 4.7 cm above the carina. Enteric tube tip is off the field of view but below the left hemidiaphragm. Shallow inspiration with atelectasis in the lung bases. Mild perihilar infiltration or edema. No blunting of costophrenic angles. No pneumothorax. Heart size and pulmonary vascularity are normal. IMPRESSION: Appliances appear in satisfactory location. Shallow inspiration with atelectasis in the lung bases and mild perihilar infiltration. Electronically Signed   By: Burman Nieves M.D.   On: 05/16/2015 01:00   Dg Abd 1 View  05/16/2015  CLINICAL DATA:  Enteric tube placement EXAM: ABDOMEN - 1 VIEW COMPARISON:  None. FINDINGS: Enteric tube tip is in the left upper quadrant consistent with location in the upper stomach. Gas fills the stomach. No abnormal distention. IMPRESSION: Enteric tube tip is in the left upper quadrant consistent with location in the upper stomach. Electronically Signed   By: Burman Nieves M.D.   On: 05/16/2015 01:01   Dg Chest Port 1 View  05/17/2015  CLINICAL DATA:  Acute respiratory failure EXAM: PORTABLE CHEST 1 VIEW COMPARISON:  05/16/2015 FINDINGS: Endotracheal tube and NG tube remain in place, unchanged. Heart is normal size. Minimal right basilar atelectasis, improved since prior study. No confluent opacity on the left. No effusions or acute bony abnormality. IMPRESSION: Some improvement in aeration with minimal residual right medial basilar atelectasis. Electronically Signed   By: Charlett Nose M.D.   On: 05/17/2015 07:23       --Wells Guiles, MD.  ICU Pager: [phone] [facility] Office Number: [phone]  Santiago Glad, M.D.  Stephanie Acre, M.D.  Billy Fischer, M.D  05/17/2015   Critical Care Attestation.  I have personally obtained a history, examined the patient, evaluated laboratory and imaging results, formulated the assessment and plan and placed orders. The Patient requires high complexity decision making for assessment and support, frequent evaluation and titration of therapies, application of advanced monitoring technologies and extensive interpretation of multiple databases. The patient has critical illness that could lead imminently to failure of 1 or more organ systems and requires the highest level of physician preparedness to intervene.  Critical Care Time devoted to patient care services described in this note is 35 minutes and is exclusive of time spent in procedures.

## 2015-05-18 LAB — URINE CULTURE: Culture: NO GROWTH

## 2015-05-18 LAB — CBC
HCT: 34.8 % — ABNORMAL LOW (ref 40.0–52.0)
Hemoglobin: 11.6 g/dL — ABNORMAL LOW (ref 13.0–18.0)
MCH: 27.8 pg (ref 26.0–34.0)
MCHC: 33.3 g/dL (ref 32.0–36.0)
MCV: 83.5 fL (ref 80.0–100.0)
PLATELETS: 212 10*3/uL (ref 150–440)
RBC: 4.16 MIL/uL — ABNORMAL LOW (ref 4.40–5.90)
RDW: 13 % (ref 11.5–14.5)
WBC: 9.1 10*3/uL (ref 3.8–10.6)

## 2015-05-18 LAB — BASIC METABOLIC PANEL
Anion gap: 8 (ref 5–15)
BUN: 7 mg/dL (ref 6–20)
CHLORIDE: 109 mmol/L (ref 101–111)
CO2: 26 mmol/L (ref 22–32)
CREATININE: 0.8 mg/dL (ref 0.61–1.24)
Calcium: 8.7 mg/dL — ABNORMAL LOW (ref 8.9–10.3)
GFR calc Af Amer: >60 mL/min (ref >60)
Glucose, Bld: 119 mg/dL — ABNORMAL HIGH (ref 65–99)
Potassium: 3.3 mmol/L — ABNORMAL LOW (ref 3.5–5.1)
SODIUM: 143 mmol/L (ref 135–145)

## 2015-05-18 MED ORDER — AMOXICILLIN-POT CLAVULANATE 875-125 MG PO TABS: 1.0000 | ORAL_TABLET | Freq: Two times a day (BID) | ORAL | Status: DC

## 2015-05-18 MED ORDER — POTASSIUM CHLORIDE 20 MEQ/15ML (10%) PO SOLN
40.0000 meq | Freq: Once | ORAL | Status: AC
  Administered 2015-05-18: 40 meq via ORAL
  Filled 2015-05-18: qty 30

## 2015-05-18 NOTE — Progress Notes (Signed)
MD ordered patient to be discharged.  Social work was consulted to provide resources for substance abuse rehab.  The social worker coordinated a center for him to go to, in which the family will take him.  Discharge instructions were reviewed with the patient and parents and they voiced understanding.  Prescription was given.  IVs were removed with catheter intact.  All questions were answered.  Patient left via wheelchair escorted by auxillary.

## 2015-05-18 NOTE — Discharge Summary (Signed)
[facility] [place] [facility]   PATIENT NAME: Russell Kennedy    MR#:  [id number]  DATE OF BIRTH:  [birth year]  DATE OF ADMISSION:  05/15/2015 ADMITTING PHYSICIAN: Erin Fulling, MD  DATE OF DISCHARGE: 05/18/2015  PRIMARY CARE PHYSICIAN: No primary care provider on file.    ADMISSION DIAGNOSIS:  Cocaine abuse [F14.10] Acute respiratory failure, unspecified whether with hypoxia or hypercapnia (HCC) [J96.00] Heroin overdose, undetermined intent, initial encounter [T40.1X4A]  DISCHARGE DIAGNOSIS:  Principal Problem:   Heroin overdose Active Problems:   Opiate abuse, episodic   Benzodiazepine abuse   Alcohol abuse   SECONDARY DIAGNOSIS:   Past Medical History  Diagnosis Date  . Polysubstance abuse     HOSPITAL COURSE:   25 year old male who presented with acute respiratory failure from cocaine and heroin overdose. For further details please refer the H&P.  1. Acute hypoxic respiratory failure: This is secondary to drug overdose and aspiration pneumonitis. Patient was initially placed on mechanical ventilation and subsequently extubated on March 23. He is no longer requiring oxygen and is doing well.  2. Cardiac arrest: Patient had CPR performed prior to admission to the ER.  3. Heroin and cocaine overdose: Patient was counseled by our psychiatrist as well as myself on the effects of drug use. Patient reports that he wants to quit. Family does have concerns about his willingness to quit. It was explained quite clearly to the patient that he almost died and if he continues like this he could eventually end up dying or in a very bad situation.    DISCHARGE CONDITIONS AND DIET:   Patient is stable for discharge and regular diet  CONSULTS OBTAINED:  Treatment Team:  Audery Amel, MD  DRUG ALLERGIES:  No Known Allergies  DISCHARGE MEDICATIONS:   Current Discharge Medication List    START taking these medications   Details   amoxicillin-clavulanate (AUGMENTIN) 875-125 MG tablet Take 1 tablet by mouth 2 (two) times daily. Qty: 16 tablet, Refills: 0              Today   CHIEF COMPLAINT:   No acute issues overnight. Patient is doing well. Patient denies suicidal or homicidal ideations. Patient says that he wants to go to outpatient rehabilitation.   VITAL SIGNS:  Blood pressure 123/73, pulse 83, temperature 98.7 F (37.1 C), temperature source Oral, resp. rate 17, height  (1.93 m), weight 126.69 kg (279 lb 4.8 oz), SpO2 90 %.   REVIEW OF SYSTEMS:  Review of Systems  Constitutional: Negative for fever, chills and malaise/fatigue.  HENT: Negative for ear discharge, ear pain, hearing loss, nosebleeds and sore throat.   Eyes: Negative for blurred vision and pain.  Respiratory: Negative for cough, hemoptysis, shortness of breath and wheezing.   Cardiovascular: Negative for chest pain, palpitations and leg swelling.  Gastrointestinal: Negative for nausea, vomiting, abdominal pain, diarrhea and blood in stool.  Genitourinary: Negative for dysuria.  Musculoskeletal: Negative for back pain.  Neurological: Negative for dizziness, tremors, speech change, focal weakness, seizures and headaches.  Endo/Heme/Allergies: Does not bruise/bleed easily.  Psychiatric/Behavioral: Negative for depression, suicidal ideas and hallucinations.     PHYSICAL EXAMINATION:  GENERAL:  25 y.o.-year-old patient lying in the bed with no acute distress.  NECK:  Supple, no jugular venous distention. No thyroid enlargement, no tenderness.  LUNGS: Normal breath sounds bilaterally, no wheezing, rales,rhonchi  No use of accessory muscles of respiration.  CARDIOVASCULAR: S1, S2 normal. No murmurs, rubs, or gallops.  ABDOMEN: Soft,  non-tender, non-distended. Bowel sounds present. No organomegaly or mass.  EXTREMITIES: No pedal edema, cyanosis, or clubbing.  PSYCHIATRIC: The patient is alert and oriented x 3.  SKIN: No obvious  rash, lesion, or ulcer.   DATA REVIEW:   CBC  Recent Labs Lab 05/18/15 0401  WBC 9.1  HGB 11.6*  HCT 34.8*  PLT 212    Chemistries   Recent Labs Lab 05/15/15 2358  05/17/15 1027 05/18/15 0401  NA 140  < > 142 143  K 3.4*  < > 4.2 3.3*  CL 105  < > 105 109  CO2 22  < > 29 26  GLUCOSE 192*  < > 116* 119*  BUN 11  < > 11 7  CREATININE 1.25*  < > 1.09 0.80  CALCIUM 8.4*  < > 8.7* 8.7*  MG  --   < > 1.9  --   AST 126*  --   --   --   ALT 91*  --   --   --   ALKPHOS 64  --   --   --   BILITOT 0.3  --   --   --   < > = values in this interval not displayed.  Cardiac Enzymes  Recent Labs Lab 05/15/15 2358 05/16/15 0651 05/16/15 1306  TROPONINI <0.03 0.12* 0.06*    Microbiology Results  @[email or web address] @  RADIOLOGY:  Dg Chest Port 1 View  05/17/2015  CLINICAL DATA:  Acute respiratory failure EXAM: PORTABLE CHEST 1 VIEW COMPARISON:  05/16/2015 FINDINGS: Endotracheal tube and NG tube remain in place, unchanged. Heart is normal size. Minimal right basilar atelectasis, improved since prior study. No confluent opacity on the left. No effusions or acute bony abnormality. IMPRESSION: Some improvement in aeration with minimal residual right medial basilar atelectasis. Electronically Signed   By: Charlett NoseKevin  Dover M.D.   On: 05/17/2015 07:23      Management plans discussed with the patient and he is in agreement. Stable for discharge home  Plan of care discussed with the family as well as Dr. Toni Amendlapacs prior to discharge.  Patient was given appropriate material by social worker prior to discharge for rehabilitation.  Patient should follow up with PCP   CODE STATUS:     Code Status Orders        Start     Ordered   05/16/15 0102  Full code   Continuous     05/16/15 0109    Code Status History    Date Active Date Inactive Code Status Order ID Comments User Context   This patient has a current code status but no historical code status.      TOTAL TIME TAKING CARE OF  THIS PATIENT: 35 minutes.    Note: This dictation was prepared with Dragon dictation along with smaller phrase technology. Any transcriptional errors that result from this process are unintentional.  Chavon Lucarelli M.D on 05/18/2015 at 11:25 AM  Between 7am to 6pm - Pager - [phone] After 6pm go to [email or web address] - Social research officer, governmentpassword EPAS [facility]  Office  [phone]  CC: Primary care physician; No primary care provider on file.

## 2015-05-18 NOTE — Clinical Social Work Note (Signed)
Clinical Social Work Assessment  Patient Details  Name: Russell Kennedy MRN: [id number] Date of Birth: 04-05-1990  Date of referral:  05/18/15               Reason for consult:  Substance Use/ETOH Abuse                Permission sought to share information with:  Facility Sport and exercise psychologist, Family Supports Permission granted to share information::  Yes, Verbal Permission Granted  Name::        Agency::     Relationship::     Contact Information:     Housing/Transportation Living arrangements for the past 2 months:  Single Family Home Source of Information:  Patient, Spouse, Parent Patient Interpreter Needed:  None Criminal Activity/Legal Involvement Pertinent to Current Situation/Hospitalization:  No - Comment as needed Significant Relationships:  Spouse, Siblings, Parents, Dependent Children Lives with:  Parents, Spouse, Minor Children Do you feel safe going back to the place where you live?  Yes Need for family participation in patient care:  Yes (Comment)  Care giving concerns:  Patient recently married his wife last week and they, along with their 62 month old baby girl live with patient's parents. Patient's parents have stated that patient is not allowed back in the home due to his illicit drug use. Patient is independent in all activities of daily living.    Social Worker assessment / plan:  CSW has spent much time with patient and family today. Patient was discharged by MD to return home today. Patient was assessed by psychiatry and was not deemed appropriate for inpatient psych admission. CSW was consulted by psychiatry to provide outpatient rehab resources although according to psychiatry's documentation, patient did not appear interested. CSW met with patient who chose initially to have his parents and wife leave the room. CSW discussed purpose of visit and to see if patient was interested in outpatient resources. Patient said that he would be interested but that his parents  were pushing for him to go to inpatient. CSW informed patient that psychiatry had not deemed him appropriate at this time for inpatient psychiatric admission. Patient requested his parents be brought back in and explained this. CSW retrieved patient's parents and patient's parents expressed concerns with this decision. CSW provided supportive counseling and listening to both patient's parents, patient, and patient's wife. Patient's parents spent much time processing with CSW how much patient's drug addiction has cost them both emotionally and financially and now what it is costing patient's current wife and 31monthold daughter and how desperate they felt patient needed rehab. Patient's father brought up how patient was recently at home with his daughter and some of his friends came over and had drugs at the home when patient's mother came home and caught them. Patient admitted to this incident and that he was using with his daughter in his care. Although CSW expressed appreciation for his honesty, CSW explained that this is a reportable offense to DSS CPS to patient and family.   CSW offered the option of looking into a long term rehab program for him or providing him with outpatient resources and that it was his decision to make. CSW spent much time explaining to patient's concerned parents that patient has to want rehab for himself and cannot be forced to want it or to go to rehab at this time. Patient's parents were very tearful and stated patient has been to rehab in the past and it did not work and that  he had gone to [facility] and actually made more drug contacts there. Patient chose for CSW to look into residential rehab programs. CSW contacted [facility] to obtain resources for this and was given [facility] in [place], [place]. CSW contacted Jenny Reichmann in admissions: [phone] and made referral. Jenny Reichmann stated she would have Jan contact me from their facility to get  further information. Jan called CSW and CSW was able to put patient on phone with Jan in order to complete the phone assessment with patient. Jan has contacted patient's insurance and it would be a $500 deductible which CSW has informed patient and parents. Patient's parents have faxed additional proof of insurance information to Jan as well. CSW has left Jan a message to determine where things stand currently.  Employment status:  Unemployed Forensic scientist:  Managed Care PT Recommendations:  Not assessed at this time Information / Referral to community resources:     Patient/Family's Response to care:  Patient and parents expressed appreciation for CSW assistance.  Patient/Family's Understanding of and Emotional Response to Diagnosis, Current Treatment, and Prognosis:  Patient willing to go to residential rehab program but CSW is concerned about if patient is truly wanting assistance or is doing this for his parents and new wife.  Emotional Assessment Appearance:  Appears stated age Attitude/Demeanor/Rapport:   (pleasant and cooperative) Affect (typically observed):  Accepting, Stoic, Calm Orientation:  Oriented to Self, Oriented to Place, Oriented to  Time, Oriented to Situation Alcohol / Substance use:  Illicit Drugs, Alcohol Use Psych involvement (Current and /or in the community):  Yes (Comment)  Discharge Needs  Concerns to be addressed:  Care Coordination, Compliance Issues Concerns Readmission within the last 30 days:  No Current discharge risk:  Substance Abuse Barriers to Discharge:  No Barriers Identified   Shela Leff, LCSW 05/18/2015, 2:18 PM

## 2015-05-21 LAB — CULTURE, RESPIRATORY

## 2015-05-21 LAB — CULTURE, BLOOD (ROUTINE X 2)
CULTURE: NO GROWTH
CULTURE: NO GROWTH

## 2015-05-21 LAB — CULTURE, RESPIRATORY W GRAM STAIN

## 2016-10-06 ENCOUNTER — Encounter: Payer: Self-pay | Admitting: Emergency Medicine

## 2016-10-06 ENCOUNTER — Emergency Department
Admission: EM | Admit: 2016-10-06 | Discharge: 2016-10-06 | Disposition: A | Discharge status: Home / Self Care | Payer: BLUE CROSS/BLUE SHIELD | Attending: Emergency Medicine | Admitting: Emergency Medicine

## 2016-10-06 DIAGNOSIS — F17200 Nicotine dependence, unspecified, uncomplicated: Secondary | ICD-10-CM | POA: Diagnosis not present

## 2016-10-06 DIAGNOSIS — T401X1A Poisoning by heroin, accidental (unintentional), initial encounter: Secondary | ICD-10-CM | POA: Diagnosis not present

## 2016-10-06 LAB — CBC
HEMATOCRIT: 38.6 % — AB (ref 40.0–52.0)
HEMOGLOBIN: 13 g/dL (ref 13.0–18.0)
MCH: 27.9 pg (ref 26.0–34.0)
MCHC: 33.6 g/dL (ref 32.0–36.0)
MCV: 82.9 fL (ref 80.0–100.0)
Platelets: 259 10*3/uL (ref 150–440)
RBC: 4.66 MIL/uL (ref 4.40–5.90)
RDW: 12.8 % (ref 11.5–14.5)
WBC: 16.1 10*3/uL — ABNORMAL HIGH (ref 3.8–10.6)

## 2016-10-06 LAB — COMPREHENSIVE METABOLIC PANEL
ALK PHOS: 48 U/L (ref 38–126)
ALT: 17 U/L (ref 17–63)
ANION GAP: 9 (ref 5–15)
AST: 19 U/L (ref 15–41)
Albumin: 4.2 g/dL (ref 3.5–5.0)
BILIRUBIN TOTAL: 0.3 mg/dL (ref 0.3–1.2)
BUN: 14 mg/dL (ref 6–20)
CALCIUM: 8.7 mg/dL — AB (ref 8.9–10.3)
CO2: 25 mmol/L (ref 22–32)
CREATININE: 0.68 mg/dL (ref 0.61–1.24)
Chloride: 103 mmol/L (ref 101–111)
GFR calc non Af Amer: >60 mL/min (ref >60)
GLUCOSE: 106 mg/dL — AB (ref 65–99)
Potassium: 3.4 mmol/L — ABNORMAL LOW (ref 3.5–5.1)
SODIUM: 137 mmol/L (ref 135–145)
TOTAL PROTEIN: 7.1 g/dL (ref 6.5–8.1)

## 2016-10-06 LAB — URINE DRUG SCREEN, QUALITATIVE (ARMC ONLY)
Amphetamines, Ur Screen: NOT DETECTED
BARBITURATES, UR SCREEN: NOT DETECTED
BENZODIAZEPINE, UR SCRN: NOT DETECTED
CANNABINOID 50 NG, UR ~~LOC~~: NOT DETECTED
COCAINE METABOLITE, UR ~~LOC~~: NOT DETECTED
MDMA (Ecstasy)Ur Screen: NOT DETECTED
Methadone Scn, Ur: NOT DETECTED
OPIATE, UR SCREEN: POSITIVE — AB
PHENCYCLIDINE (PCP) UR S: NOT DETECTED
Tricyclic, Ur Screen: NOT DETECTED

## 2016-10-06 LAB — SALICYLATE LEVEL

## 2016-10-06 LAB — ETHANOL: Alcohol, Ethyl (B): 14 mg/dL — ABNORMAL HIGH (ref <5)

## 2016-10-06 LAB — ACETAMINOPHEN LEVEL

## 2016-10-06 MED ORDER — IBUPROFEN 600 MG PO TABS
ORAL_TABLET | ORAL | Status: AC
  Filled 2016-10-06: qty 1

## 2016-10-06 MED ORDER — IBUPROFEN 600 MG PO TABS
600.0000 mg | ORAL_TABLET | Freq: Once | ORAL | Status: AC
  Administered 2016-10-06: 600 mg via ORAL

## 2016-10-06 NOTE — ED Notes (Signed)
Pt discharged to lobby to join waiting family, asked this RN to shred his DC papers and wristband

## 2016-10-06 NOTE — ED Provider Notes (Signed)
[facility] Emergency Department Provider Note   ____________________________________________   First MD Initiated Contact with Patient 10/06/16 1832     (approximate)  I have reviewed the triage vital signs and the nursing notes.   HISTORY  Chief Complaint Drug Overdose   HPI Russell Kennedy is a 26 y.o. male Who reports he had been addicted to heroin and had been in the Suboxone clinic but then stopped and was doing well and staying clean until today when he relapsed. He was found unresponsive by his wife. And came to the ER after being committed because he did not want to come to the ER by himself. Patient seen in the ER is awake alert and remained so psychiatry reverse the commitment. Patient realizes he needs to follow-up for further treatment.   Past Medical History:  Diagnosis Date  . Polysubstance abuse     Patient Active Problem List   Diagnosis Date Noted  . Opiate abuse, episodic 05/17/2015  . Benzodiazepine abuse 05/17/2015  . Alcohol abuse 05/17/2015  . Heroin overdose 05/16/2015    Past Surgical History:  Procedure Laterality Date  . ROTATOR CUFF REPAIR      Prior to Admission medications   Medication Sig Start Date End Date Taking? Authorizing Provider  amoxicillin-clavulanate (AUGMENTIN) 875-125 MG tablet Take 1 tablet by mouth 2 (two) times daily. Patient not taking: Reported on 10/06/2016 05/18/15   Adrian Saran, MD    Allergies Patient has no known allergies.  No family history on file.  Social History Social History  Substance Use Topics  . Smoking status: Current Every Day Smoker  . Smokeless tobacco: Never Used  . Alcohol use Yes    Review of Systems  Constitutional: No fever/chills Eyes: No visual changes. ENT: No sore throat. Cardiovascular: Denies chest pain. Respiratory: Denies shortness of breath. Gastrointestinal: No abdominal pain.  No nausea, no vomiting.  No diarrhea.  No constipation. Genitourinary:  Negative for dysuria. Musculoskeletal: Negative for back pain. Skin: Negative for rash. Neurological: Negative for headaches, focal weakness   ____________________________________________   PHYSICAL EXAM:  VITAL SIGNS: ED Triage Vitals  Enc Vitals Group     BP 10/06/16 1741 115/67     Pulse Rate 10/06/16 1741 72     Resp 10/06/16 1741 17     Temp 10/06/16 1741 (!) 97.5 F (36.4 C)     Temp Source 10/06/16 1741 Oral     SpO2 10/06/16 1741 96 %     Weight 10/06/16 1742 260 lb (117.9 kg)     Height 10/06/16 1742  (1.956 m)     Head Circumference --      Peak Flow --      Pain Score 10/06/16 2001 9     Pain Loc --      Pain Edu? --      Excl. in GC? --     Constitutional: Alert and oriented. Well appearing and in no acute distress. Eyes: Conjunctivae are normal. PERRL. EOMI. Head: Atraumatic. Nose: No congestion/rhinnorhea. Mouth/Throat: Mucous membranes are moist.  Oropharynx non-erythematous. Neck: No stridor. Cardiovascular: Normal rate, regular rhythm. Grossly normal heart sounds.  Good peripheral circulation. Respiratory: Normal respiratory effort.  No retractions. Lungs CTAB. Gastrointestinal: Soft and nontender. No distention. No abdominal bruits. No CVA tenderness. Musculoskeletal: No lower extremity tenderness nor edema.  No joint effusions. Neurologic:  Normal speech and language. No gross focal neurologic deficits are appreciated. No gait instability. Skin:  Skin is warm, dry and intact.  No rash noted. Psychiatric: Mood and affect are normal. Speech and behavior are normal.  ____________________________________________   LABS (all labs ordered are listed, but only abnormal results are displayed)  Labs Reviewed  COMPREHENSIVE METABOLIC PANEL - Abnormal; Notable for the following:       Result Value   Potassium 3.4 (*)    Glucose, Bld 106 (*)    Calcium 8.7 (*)    All other components within normal limits  ETHANOL - Abnormal; Notable for the  following:    Alcohol, Ethyl (B) 14 (*)    All other components within normal limits  ACETAMINOPHEN LEVEL - Abnormal; Notable for the following:    Acetaminophen (Tylenol), Serum <10 (*)    All other components within normal limits  CBC - Abnormal; Notable for the following:    WBC 16.1 (*)    HCT 38.6 (*)    All other components within normal limits  URINE DRUG SCREEN, QUALITATIVE ([facility] ONLY) - Abnormal; Notable for the following:    Opiate, Ur Screen POSITIVE (*)    All other components within normal limits  SALICYLATE LEVEL   ____________________________________________  EKG   ____________________________________________  RADIOLOGY   ____________________________________________   PROCEDURES  Procedure(s) performed:   Procedures  Critical Care performed:   ____________________________________________   INITIAL IMPRESSION / ASSESSMENT AND PLAN / ED COURSE  Pertinent labs & imaging results that were available during my care of the patient were reviewed by me and considered in my medical decision making (see chart for details).        ____________________________________________   FINAL CLINICAL IMPRESSION(S) / ED DIAGNOSES  Final diagnoses:  Accidental overdose of heroin, initial encounter      NEW MEDICATIONS STARTED DURING THIS VISIT:  New Prescriptions   No medications on file     Note:  This document was prepared using Dragon voice recognition software and may include unintentional dictation errors.    Arnaldo Natal, MD 10/06/16 2128

## 2016-10-06 NOTE — ED Notes (Signed)
Pt dressed out with RN and male [employer] [place] [employer]

## 2016-10-06 NOTE — ED Notes (Signed)
Pt given drink att
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
## 2016-10-06 NOTE — Discharge Instructions (Signed)
Please follow-up with your family doctor and with the Suboxone clinic that you used to go to. You can also follow up with Narcotics Anonymous. please return here for any further problems.

## 2017-09-03 ENCOUNTER — Other Ambulatory Visit: Payer: Self-pay

## 2017-09-03 ENCOUNTER — Emergency Department: Payer: BLUE CROSS/BLUE SHIELD

## 2017-09-03 ENCOUNTER — Encounter: Payer: Self-pay | Admitting: Radiology

## 2017-09-03 ENCOUNTER — Observation Stay
Admission: EM | Admit: 2017-09-03 | Discharge: 2017-09-04 | Disposition: A | Discharge status: Home / Self Care | Payer: BLUE CROSS/BLUE SHIELD | Attending: Surgery | Admitting: Surgery

## 2017-09-03 DIAGNOSIS — F331 Major depressive disorder, recurrent, moderate: Secondary | ICD-10-CM | POA: Diagnosis not present

## 2017-09-03 DIAGNOSIS — F17200 Nicotine dependence, unspecified, uncomplicated: Secondary | ICD-10-CM | POA: Insufficient documentation

## 2017-09-03 DIAGNOSIS — S27321A Contusion of lung, unilateral, initial encounter: Secondary | ICD-10-CM | POA: Diagnosis not present

## 2017-09-03 DIAGNOSIS — R079 Chest pain, unspecified: Secondary | ICD-10-CM | POA: Diagnosis present

## 2017-09-03 DIAGNOSIS — D72829 Elevated white blood cell count, unspecified: Secondary | ICD-10-CM | POA: Diagnosis not present

## 2017-09-03 DIAGNOSIS — F1210 Cannabis abuse, uncomplicated: Secondary | ICD-10-CM | POA: Insufficient documentation

## 2017-09-03 DIAGNOSIS — F1020 Alcohol dependence, uncomplicated: Secondary | ICD-10-CM | POA: Diagnosis present

## 2017-09-03 DIAGNOSIS — F1910 Other psychoactive substance abuse, uncomplicated: Secondary | ICD-10-CM | POA: Diagnosis not present

## 2017-09-03 DIAGNOSIS — S299XXA Unspecified injury of thorax, initial encounter: Secondary | ICD-10-CM | POA: Diagnosis present

## 2017-09-03 DIAGNOSIS — F1420 Cocaine dependence, uncomplicated: Secondary | ICD-10-CM | POA: Diagnosis present

## 2017-09-03 LAB — CBC
HCT: 40.8 % (ref 40.0–52.0)
HEMATOCRIT: 40.7 % (ref 40.0–52.0)
HEMOGLOBIN: 14 g/dL (ref 13.0–18.0)
Hemoglobin: 14.3 g/dL (ref 13.0–18.0)
MCH: 28 pg (ref 26.0–34.0)
MCH: 28.1 pg (ref 26.0–34.0)
MCHC: 34.5 g/dL (ref 32.0–36.0)
MCHC: 35 g/dL (ref 32.0–36.0)
MCV: 80.3 fL (ref 80.0–100.0)
MCV: 81 fL (ref 80.0–100.0)
Platelets: 247 10*3/uL (ref 150–440)
Platelets: 270 10*3/uL (ref 150–440)
RBC: 5.08 MIL/uL (ref 4.40–5.90)
RBC: 5.02 MIL/uL (ref 4.40–5.90)
RDW: 13.3 % (ref 11.5–14.5)
RDW: 13.4 % (ref 11.5–14.5)
WBC: 16.2 10*3/uL — ABNORMAL HIGH (ref 3.8–10.6)
WBC: 14.2 10*3/uL — AB (ref 3.8–10.6)

## 2017-09-03 LAB — URINE DRUG SCREEN, QUALITATIVE (ARMC ONLY)
Amphetamines, Ur Screen: NOT DETECTED
BARBITURATES, UR SCREEN: NOT DETECTED
CANNABINOID 50 NG, UR ~~LOC~~: NOT DETECTED
COCAINE METABOLITE, UR ~~LOC~~: POSITIVE — AB
MDMA (Ecstasy)Ur Screen: NOT DETECTED
Methadone Scn, Ur: NOT DETECTED
Opiate, Ur Screen: NOT DETECTED
PHENCYCLIDINE (PCP) UR S: NOT DETECTED
Tricyclic, Ur Screen: NOT DETECTED

## 2017-09-03 LAB — BASIC METABOLIC PANEL
Anion gap: 10 (ref 5–15)
BUN: 11 mg/dL (ref 6–20)
CALCIUM: 9.3 mg/dL (ref 8.9–10.3)
CO2: 26 mmol/L (ref 22–32)
CREATININE: 0.71 mg/dL (ref 0.61–1.24)
Chloride: 104 mmol/L (ref 98–111)
GFR calc non Af Amer: >60 mL/min (ref >60)
Glucose, Bld: 111 mg/dL — ABNORMAL HIGH (ref 70–99)
Potassium: 3.6 mmol/L (ref 3.5–5.1)
SODIUM: 140 mmol/L (ref 135–145)

## 2017-09-03 LAB — ETHANOL: ALCOHOL ETHYL (B): 75 mg/dL — AB (ref <10)

## 2017-09-03 LAB — CREATININE, SERUM: Creatinine, Ser: 0.76 mg/dL (ref 0.61–1.24)

## 2017-09-03 MED ORDER — ENOXAPARIN SODIUM 40 MG/0.4ML ~~LOC~~ SOLN
40.0000 mg | SUBCUTANEOUS | Status: DC
  Administered 2017-09-03 – 2017-09-04 (×2): 40 mg via SUBCUTANEOUS
  Filled 2017-09-03 (×2): qty 0.4

## 2017-09-03 MED ORDER — TRAZODONE HCL 50 MG PO TABS
150.0000 mg | ORAL_TABLET | Freq: Every day | ORAL | Status: DC
  Administered 2017-09-03: 150 mg via ORAL
  Filled 2017-09-03: qty 1

## 2017-09-03 MED ORDER — SODIUM CHLORIDE 0.9% FLUSH
3.0000 mL | Freq: Two times a day (BID) | INTRAVENOUS | Status: DC
  Administered 2017-09-03: 3 mL via INTRAVENOUS

## 2017-09-03 MED ORDER — ESCITALOPRAM OXALATE 10 MG PO TABS
20.0000 mg | ORAL_TABLET | Freq: Every day | ORAL | Status: DC
  Administered 2017-09-03: 20 mg via ORAL
  Filled 2017-09-03 (×2): qty 2

## 2017-09-03 MED ORDER — ACETAMINOPHEN 325 MG PO TABS
650.0000 mg | ORAL_TABLET | Freq: Four times a day (QID) | ORAL | Status: DC | PRN
  Administered 2017-09-03 (×2): 650 mg via ORAL
  Filled 2017-09-03 (×2): qty 2

## 2017-09-03 MED ORDER — IOHEXOL 300 MG/ML  SOLN
125.0000 mL | Freq: Once | INTRAMUSCULAR | Status: AC | PRN
  Administered 2017-09-03: 125 mL via INTRAVENOUS

## 2017-09-03 MED ORDER — BUPRENORPHINE HCL-NALOXONE HCL 8-2 MG SL SUBL
1.0000 | SUBLINGUAL_TABLET | Freq: Every day | SUBLINGUAL | Status: DC
  Filled 2017-09-03: qty 1
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
## 2017-09-03 NOTE — Plan of Care (Signed)
Patient does not have a clear goals for his future. Discuss with patient about making better choices.

## 2017-09-03 NOTE — Plan of Care (Signed)
Patient admitted at 0500. Complains of rib pain, treated with tylenol. Patient profile completed. Patient refused nicotine patch. Patient denies any suicidal thoughts.

## 2017-09-03 NOTE — ED Notes (Signed)
Discussed pt's increasing pain with Dr Manson PasseyBrown, no new orders given at this time

## 2017-09-03 NOTE — ED Notes (Signed)
Pt resting in bed; warm blanket provided for comfort; pt and father understand pt is still NPO until otherwise informed; waiting on MD follow up with CT results;
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
## 2017-09-03 NOTE — Clinical Social Work Note (Signed)
CSW received consult for patient needing substance abuse resources.  Per psychiatry, patient was just released from a 60 day substance abuse treatment program.  Patient informed psychiatry that he is not interested in substance abuse treatment again.  Psychiatry reported that patient sees a psychiatrist in [place].  CSW to sign off pleaser reconsult if social work needs arise.  Russell KnackEric R. Nahlia Kennedy, MSW, Russell MajorsLCSWA [phone]  09/03/2017 6:03 PM
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
## 2017-09-04 ENCOUNTER — Observation Stay: Payer: BLUE CROSS/BLUE SHIELD

## 2017-09-04 LAB — CBC WITH DIFFERENTIAL/PLATELET
BASOS ABS: 0 10*3/uL (ref 0–0.1)
Basophils Relative: 1 %
Eosinophils Absolute: 0.3 10*3/uL (ref 0–0.7)
Eosinophils Relative: 2 %
HEMATOCRIT: 40.1 % (ref 40.0–52.0)
HEMOGLOBIN: 13.8 g/dL (ref 13.0–18.0)
LYMPHS ABS: 2.3 10*3/uL (ref 1.0–3.6)
LYMPHS PCT: 22 %
MCH: 28.1 pg (ref 26.0–34.0)
MCHC: 34.5 g/dL (ref 32.0–36.0)
MCV: 81.4 fL (ref 80.0–100.0)
Monocytes Absolute: 1 10*3/uL (ref 0.2–1.0)
Monocytes Relative: 10 %
NEUTROS ABS: 7 10*3/uL — AB (ref 1.4–6.5)
NEUTROS PCT: 65 %
Platelets: 235 10*3/uL (ref 150–440)
RBC: 4.92 MIL/uL (ref 4.40–5.90)
RDW: 13.5 % (ref 11.5–14.5)
WBC: 10.5 10*3/uL (ref 3.8–10.6)

## 2017-09-04 LAB — HIV ANTIBODY (ROUTINE TESTING W REFLEX): HIV Screen 4th Generation wRfx: NONREACTIVE

## 2017-09-04 NOTE — Progress Notes (Addendum)
Upon entering room to complete discharge, patient and family asked if they had any questions. Father and mother very concerned about patients discharge and seemed very upset. Patient stated to his parents that he would go to a rehab facility. Dr. Jennet MaduroPucilowska notified and CSW notified. CSW to come speak with family. Pt wanting to take suboxone but per family if he goes to treatment facility that they would like he will not be accepted with this is his system. Holding suboxone for now.

## 2017-09-04 NOTE — Progress Notes (Signed)
Patient and family ready for discharge. Discharge instructions given to patient and family. Verbalized understanding. No acute distress at this time.

## 2017-09-04 NOTE — Discharge Instructions (Signed)
Blunt Chest Trauma °Blunt chest trauma is an injury that is caused by a hard, direct hit (blow) to the chest. The blow can be strong enough to injure multiple body parts. Blunt chest trauma often results in bruised or broken (fractured) ribs. In many cases, the soft tissue in the chest wall is also injured, and this causes pain and bruising. Internal organs, such as the heart and lungs, can become injured as well. °Blunt chest trauma can lead to serious medical problems. This injury requires immediate medical care. °What are the causes? °Causes of blunt chest trauma include: °· Motor vehicle collisions. °· Falls. °· Physical violence. °· Sports injuries. °What are the signs or symptoms? °Symptoms of this condition include: °· Chest pain. The pain may be worse when you move or breathe deeply. °· Shortness of breath. °· Light-headedness. °· Bruising. °· Tenderness. °· Swelling. °How is this diagnosed? °This condition is diagnosed with a medical history and physical exam. You may also have imaging tests, including: °· X-rays. °· Ultrasounds. °· CT scans. °· MRI. °How is this treated? °Treatment for this condition varies depending on the type of injury you have and its severity. You may need to stay in the hospital until you recover. Treatment may include: °· Pain medicine. You may be given pain medicine through an IV tube at first. You may also be given over-the-counter and °· prescription pain relievers. °· Tubes or other devices to help you breathe. °· Surgery to repair broken ribs and fix the surrounding damaged tissue and organs. °Follow these instructions at home: °· If directed, apply ice to the injured area: °¨ Put ice in a plastic bag. °¨ Place a towel between your skin and the bag. °¨ Leave the ice on for 20 minutes, 2-3 times per day. °· Take over-the-counter and prescription medicines only as told by your health care provider. °· Keep all follow-up visits as told by your health care provider. This is  important. °Contact a health care provider if: °· Your pain gets worse after treatment. °· You have nausea or you vomit. °· You have pain in your abdomen. °· You have a fever. °· You feel dizzy or weak. °Get help right away if: °· You have shortness of breath. °· You cough up blood. °· You faint. °· You have severe chest pain. This may come with other symptoms, such as: °¨ Dizziness. °¨ Shortness of breath. °¨ Pain in your neck, jaw, or back, or in one arm or both arms. °This information is not intended to replace advice given to you by your health care provider. Make sure you discuss any questions you have with your health care provider. °Document Released: 02/17/2004 Document Revised: 06/23/2015 Document Reviewed: 07/08/2014 °Elsevier Interactive Patient Education © 2017 [employer] ° °
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
## 2021-07-05 ENCOUNTER — Ambulatory Visit
Admission: RE | Admit: 2021-07-05 | Discharge: 2021-07-05 | Disposition: A | Discharge status: Home / Self Care | Payer: Self-pay | Source: Ambulatory Visit | Attending: Emergency Medicine | Admitting: Emergency Medicine

## 2021-07-05 VITALS — BP 141/94 | HR 82 | Temp 98.2°F | Resp 18

## 2021-07-05 DIAGNOSIS — H6122 Impacted cerumen, left ear: Secondary | ICD-10-CM
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
## 2021-07-05 NOTE — ED Triage Notes (Signed)
Patient presents to Urgent Care with complaints of left ear fullness x 1 day. He states he has wax buildup. He tried pouring hot peroxide in ear with no improvement.

## 2021-07-05 NOTE — Discharge Instructions (Addendum)
Follow up with your primary care provider if your symptoms are not improving.     

## 2023-10-02 ENCOUNTER — Other Ambulatory Visit: Payer: Self-pay

## 2023-10-02 ENCOUNTER — Emergency Department (HOSPITAL_COMMUNITY): Payer: MEDICAID

## 2023-10-02 ENCOUNTER — Encounter (HOSPITAL_COMMUNITY): Payer: Self-pay

## 2023-10-02 ENCOUNTER — Emergency Department (HOSPITAL_COMMUNITY)
Admission: EM | Admit: 2023-10-02 | Discharge: 2023-10-02 | Disposition: A | Discharge status: Home / Self Care | Payer: MEDICAID | Attending: Emergency Medicine | Admitting: Emergency Medicine

## 2023-10-02 DIAGNOSIS — F17210 Nicotine dependence, cigarettes, uncomplicated: Secondary | ICD-10-CM | POA: Insufficient documentation

## 2023-10-02 DIAGNOSIS — S0990XA Unspecified injury of head, initial encounter: Secondary | ICD-10-CM | POA: Diagnosis present

## 2023-10-02 DIAGNOSIS — S0101XA Laceration without foreign body of scalp, initial encounter: Secondary | ICD-10-CM | POA: Insufficient documentation

## 2023-10-02 DIAGNOSIS — R791 Abnormal coagulation profile: Secondary | ICD-10-CM | POA: Insufficient documentation

## 2023-10-02 DIAGNOSIS — W01198A Fall on same level from slipping, tripping and stumbling with subsequent striking against other object, initial encounter: Secondary | ICD-10-CM | POA: Diagnosis not present

## 2023-10-02 DIAGNOSIS — R569 Unspecified convulsions: Secondary | ICD-10-CM | POA: Diagnosis not present

## 2023-10-02 DIAGNOSIS — Y92009 Unspecified place in unspecified non-institutional (private) residence as the place of occurrence of the external cause: Secondary | ICD-10-CM | POA: Insufficient documentation

## 2023-10-02 DIAGNOSIS — R561 Post traumatic seizures: Secondary | ICD-10-CM

## 2023-10-02 LAB — PROTIME-INR
INR: 1 (ref 0.8–1.2)
Prothrombin Time: 13.7 s (ref 11.4–15.2)

## 2023-10-02 LAB — COMPREHENSIVE METABOLIC PANEL WITH GFR
ALT: 21 U/L (ref 0–44)
AST: 34 U/L (ref 15–41)
Albumin: 4.3 g/dL (ref 3.5–5.0)
Alkaline Phosphatase: 68 U/L (ref 38–126)
Anion gap: 17 — ABNORMAL HIGH (ref 5–15)
BUN: 10 mg/dL (ref 6–20)
CO2: 22 mmol/L (ref 22–32)
Calcium: 9.9 mg/dL (ref 8.9–10.3)
Chloride: 98 mmol/L (ref 98–111)
Creatinine, Ser: 1.08 mg/dL (ref 0.61–1.24)
GFR, Estimated: >60 mL/min (ref >60)
Glucose, Bld: 128 mg/dL — ABNORMAL HIGH (ref 70–99)
Potassium: 3.5 mmol/L (ref 3.5–5.1)
Sodium: 137 mmol/L (ref 135–145)
Total Bilirubin: 0.7 mg/dL (ref 0.0–1.2)
Total Protein: 8.5 g/dL — ABNORMAL HIGH (ref 6.5–8.1)

## 2023-10-02 LAB — CBC WITH DIFFERENTIAL/PLATELET
Abs Immature Granulocytes: 0.1 K/uL — ABNORMAL HIGH (ref 0.00–0.07)
Basophils Absolute: 0 K/uL (ref 0.0–0.1)
Basophils Relative: 0 %
Eosinophils Absolute: 0 K/uL (ref 0.0–0.5)
Eosinophils Relative: 0 %
HCT: 44.9 % (ref 39.0–52.0)
Hemoglobin: 14.8 g/dL (ref 13.0–17.0)
Immature Granulocytes: 1 %
Lymphocytes Relative: 7 %
Lymphs Abs: 1.4 K/uL (ref 0.7–4.0)
MCH: 26.9 pg (ref 26.0–34.0)
MCHC: 33 g/dL (ref 30.0–36.0)
MCV: 81.5 fL (ref 80.0–100.0)
Monocytes Absolute: 0.8 K/uL (ref 0.1–1.0)
Monocytes Relative: 4 %
Neutro Abs: 17.8 K/uL — ABNORMAL HIGH (ref 1.7–7.7)
Neutrophils Relative %: 88 %
Platelets: 426 K/uL — ABNORMAL HIGH (ref 150–400)
RBC: 5.51 MIL/uL (ref 4.22–5.81)
RDW: 13.4 % (ref 11.5–15.5)
WBC: 20.1 K/uL — ABNORMAL HIGH (ref 4.0–10.5)
nRBC: 0 % (ref 0.0–0.2)

## 2023-10-02 LAB — I-STAT VENOUS BLOOD GAS, ED
Acid-Base Excess: 2 mmol/L (ref 0.0–2.0)
Bicarbonate: 25.2 mmol/L (ref 20.0–28.0)
Calcium, Ion: 1.16 mmol/L (ref 1.15–1.40)
HCT: 44 % (ref 39.0–52.0)
Hemoglobin: 15 g/dL (ref 13.0–17.0)
O2 Saturation: 95 %
Potassium: 4 mmol/L (ref 3.5–5.1)
Sodium: 138 mmol/L (ref 135–145)
TCO2: 26 mmol/L (ref 22–32)
pCO2, Ven: 34.8 mmHg — ABNORMAL LOW (ref 44–60)
pH, Ven: 7.469 — ABNORMAL HIGH (ref 7.25–7.43)
pO2, Ven: 72 mmHg — ABNORMAL HIGH (ref 32–45)

## 2023-10-02 LAB — MAGNESIUM: Magnesium: 2.1 mg/dL (ref 1.7–2.4)

## 2023-10-02 LAB — I-STAT CG4 LACTIC ACID, ED
Lactic Acid, Venous: 6 mmol/L (ref 0.5–1.9)
Lactic Acid, Venous: 1.9 mmol/L (ref 0.5–1.9)

## 2023-10-02 LAB — I-STAT CHEM 8, ED
BUN: 13 mg/dL (ref 6–20)
Calcium, Ion: 1.16 mmol/L (ref 1.15–1.40)
Chloride: 101 mmol/L (ref 98–111)
Creatinine, Ser: 1 mg/dL (ref 0.61–1.24)
Glucose, Bld: 136 mg/dL — ABNORMAL HIGH (ref 70–99)
HCT: 46 % (ref 39.0–52.0)
Hemoglobin: 15.6 g/dL (ref 13.0–17.0)
Potassium: 4 mmol/L (ref 3.5–5.1)
Sodium: 138 mmol/L (ref 135–145)
TCO2: 24 mmol/L (ref 22–32)

## 2023-10-02 LAB — SAMPLE TO BLOOD BANK

## 2023-10-02 LAB — ETHANOL: Alcohol, Ethyl (B): <15 mg/dL (ref <15)

## 2023-10-02 LAB — CBG MONITORING, ED: Glucose-Capillary: 127 mg/dL — ABNORMAL HIGH (ref 70–99)

## 2023-10-02 MED ORDER — SODIUM CHLORIDE 0.9 % IV BOLUS
1000.0000 mL | Freq: Once | INTRAVENOUS | Status: AC
  Administered 2023-10-02: 1000 mL via INTRAVENOUS

## 2023-10-02 MED ORDER — TETANUS-DIPHTH-ACELL PERTUSSIS 5-2.5-18.5 LF-MCG/0.5 IM SUSY
0.5000 mL | PREFILLED_SYRINGE | Freq: Once | INTRAMUSCULAR | Status: AC
  Administered 2023-10-02: 0.5 mL via INTRAMUSCULAR
  Filled 2023-10-02: qty 0.5

## 2023-10-02 NOTE — ED Provider Notes (Signed)
 [facility] AT [facility] Provider Note  CSN: [id number] Arrival date & time: 10/02/23 1506  Chief Complaint(s) Head Laceration Russell Kennedy and struck his head on a brick block)  HPI Russell Kennedy is a 33 y.o. male history of polysubstance abuse presenting to the emergency department with head injury.  Patient was out in the yard and tripped over a garden hoe, apparently was witnessed to fall backward and hit his head, lose consciousness and have a brief seizure episode.  Denies any history of seizures.  No nausea, vomiting.  Denies any pain currently.  Denies IV drug use but snorts fentanyl  and use methadone today.  Mother also gave him 2 mg of Narcan .  Initially was GCS 14 for EMS but improved.  Patient overall feels well.   Past Medical History Past Medical History:  Diagnosis Date   Polysubstance abuse [facility])    Patient Active Problem List   Diagnosis Date Noted   Trauma of chest 09/03/2017   Cocaine use disorder, moderate, dependence (HCC) 09/03/2017   Major depressive disorder, recurrent episode, moderate (HCC) 09/03/2017   Tobacco use disorder 09/03/2017   Opiate abuse, episodic (HCC) 05/17/2015   Benzodiazepine abuse (HCC) 05/17/2015   Alcohol use disorder, moderate, dependence (HCC) 05/17/2015   Heroin overdose (HCC) 05/16/2015   Home Medication(s) Prior to Admission medications   Medication Sig Start Date End Date Taking? Authorizing Provider  Buprenorphine  HCl-Naloxone  HCl 8-2 MG FILM Place 1 Film under the tongue 2 (two) times daily. 08/21/17   [provider]  doxepin (SINEQUAN) 25 MG capsule Take 1 capsule by mouth daily. 07/18/17   [provider]  escitalopram  (LEXAPRO ) 20 MG tablet Take 20 mg by mouth daily. 08/21/17   [provider]  hydrOXYzine (VISTARIL) 50 MG capsule Take 1 capsule by mouth at bedtime.  07/05/17   [provider]  ibuprofen  (ADVIL ,MOTRIN ) 600 MG tablet Take 1 tablet by mouth 2 (two) times  daily as needed. 07/19/17   [provider]                                                                                                                                    Past Surgical History Past Surgical History:  Procedure Laterality Date   ROTATOR CUFF REPAIR     Family History No family history on file.  Social History Social History   Tobacco Use   Smoking status: Every Day    Types: Cigarettes   Smokeless tobacco: Never  Vaping Use   Vaping status: Never Used  Substance Use Topics   Alcohol use: Not Currently   Drug use: Yes    Comment: heroin, fentanyl    Allergies Patient has no known allergies.  Review of Systems Review of Systems  All other systems reviewed and are negative.   Physical Exam Vital Signs  I have reviewed the triage vital signs BP (!) 189/116   Pulse 99  Temp 97.9 F (36.6 C) (Oral)   Resp 13   Ht 6' 5 (1.956 m)   Wt 127 kg   SpO2 100%   BMI 33.20 kg/m  Physical Exam Vitals and nursing note reviewed.  Constitutional:      General: He is not in acute distress.    Appearance: Normal appearance.  HENT:     Head: Normocephalic.     Comments: Approximately 1 cm right lateral posterior scalp laceration.  Dried blood around mouth but no malocclusion, no facial pain or tenderness, no nasal symptoms    Mouth/Throat:     Mouth: Mucous membranes are moist.  Eyes:     Conjunctiva/sclera: Conjunctivae normal.  Cardiovascular:     Rate and Rhythm: Normal rate and regular rhythm.  Pulmonary:     Effort: Pulmonary effort is normal. No respiratory distress.     Breath sounds: Normal breath sounds.  Abdominal:     General: Abdomen is flat.     Palpations: Abdomen is soft.     Tenderness: There is no abdominal tenderness.  Musculoskeletal:     Right lower leg: No edema.     Left lower leg: No edema.     Comments: No midline C, T, L-spine tenderness.  No chest wall tenderness or crepitus.  Full painless range of motion at the  bilateral upper extremities including the shoulders, elbows, wrists, hand and fingers, and in the bilateral lower extremities including the hips, knees, ankle, toes.  No focal bony tenderness, injury or deformity.   Skin:    General: Skin is warm and dry.     Capillary Refill: Capillary refill takes less than 2 seconds.  Neurological:     Mental Status: He is alert and oriented to person, place, and time. Mental status is at baseline.  Psychiatric:        Mood and Affect: Mood normal.        Behavior: Behavior normal.     ED Results and Treatments Labs (all labs ordered are listed, but only abnormal results are displayed) Labs Reviewed  CBC WITH DIFFERENTIAL/PLATELET - Abnormal; Notable for the following components:      Result Value   WBC 20.1 (*)    Platelets 426 (*)    Neutro Abs 17.8 (*)    Abs Immature Granulocytes 0.10 (*)    All other components within normal limits  COMPREHENSIVE METABOLIC PANEL WITH GFR - Abnormal; Notable for the following components:   Glucose, Bld 128 (*)    Total Protein 8.5 (*)    Anion gap 17 (*)    All other components within normal limits  CBG MONITORING, ED - Abnormal; Notable for the following components:   Glucose-Capillary 127 (*)    All other components within normal limits  I-STAT CHEM 8, ED - Abnormal; Notable for the following components:   Glucose, Bld 136 (*)    All other components within normal limits  I-STAT CG4 LACTIC ACID, ED - Abnormal; Notable for the following components:   Lactic Acid, Venous 6.0 (*)    All other components within normal limits  I-STAT VENOUS BLOOD GAS, ED - Abnormal; Notable for the following components:   pH, Ven 7.469 (*)    pCO2, Ven 34.8 (*)    pO2, Ven 72 (*)    All other components within normal limits  ETHANOL  MAGNESIUM   PROTIME-INR  RAPID URINE DRUG SCREEN, HOSP PERFORMED  URINALYSIS, ROUTINE W REFLEX MICROSCOPIC  I-STAT CG4 LACTIC ACID, ED  SAMPLE  TO BLOOD BANK                                                                                                                           Radiology CT Cervical Spine Wo Contrast Result Date: 10/02/2023 CLINICAL DATA:  Russell Kennedy, hit head, loss of consciousness EXAM: CT CERVICAL SPINE WITHOUT CONTRAST TECHNIQUE: Multidetector CT imaging of the cervical spine was performed without intravenous contrast. Multiplanar CT image reconstructions were also generated. RADIATION DOSE REDUCTION: This exam was performed according to the departmental dose-optimization program which includes automated exposure control, adjustment of the mA and/or kV according to patient size and/or use of iterative reconstruction technique. COMPARISON:  09/03/2017 FINDINGS: Alignment: Alignment is grossly anatomic. Skull base and vertebrae: No acute fracture. No primary bone lesion or focal pathologic process. Soft tissues and spinal canal: No prevertebral fluid or swelling. No visible canal hematoma. Disc levels:  Mild multilevel spondylosis from C3-4 through C5-6. Upper chest: Airway is patent. Visualized portions of the lung apices are clear. Other: Reconstructed images demonstrate no additional findings. IMPRESSION: 1. No acute cervical spine fracture. Electronically Signed   By: Ozell Daring M.D.   On: 10/02/2023 16:47   CT Head Wo Contrast Result Date: 10/02/2023 CLINICAL DATA:  Witnessed fall, hit head, loss of consciousness EXAM: CT HEAD WITHOUT CONTRAST TECHNIQUE: Contiguous axial images were obtained from the base of the skull through the vertex without intravenous contrast. RADIATION DOSE REDUCTION: This exam was performed according to the departmental dose-optimization program which includes automated exposure control, adjustment of the mA and/or kV according to patient size and/or use of iterative reconstruction technique. COMPARISON:  02/19/2014 FINDINGS: Brain: No acute infarct or hemorrhage. Lateral ventricles and midline structures are unremarkable. No acute extra-axial  fluid collections. No mass effect. Vascular: No hyperdense vessel or unexpected calcification. Skull: Normal. Negative for fracture or focal lesion. Sinuses/Orbits: Opacification of the left maxillary sinus. Polypoid mucosal thickening right maxillary sinus. Other: None. IMPRESSION: 1. No acute intracranial process. Electronically Signed   By: Ozell Daring M.D.   On: 10/02/2023 16:45   DG Chest Portable 1 View Result Date: 10/02/2023 CLINICAL DATA:  fall/trauma. EXAM: PORTABLE CHEST 1 VIEW COMPARISON:  09/04/2017. FINDINGS: Low lung volume. Bilateral lung fields are clear. Bilateral costophrenic angles are clear. Note is made of elevated right hemidiaphragm. Normal cardio-mediastinal silhouette. No acute osseous abnormalities. The soft tissues are within normal limits. IMPRESSION: No active disease. Electronically Signed   By: Ree Molt M.D.   On: 10/02/2023 15:27    Pertinent labs & imaging results that were available during my care of the patient were reviewed by me and considered in my medical decision making (see MDM for details).  Medications Ordered in ED Medications  sodium chloride  0.9 % bolus 1,000 mL (0 mLs Intravenous Stopped 10/02/23 1735)  Tdap (BOOSTRIX ) injection 0.5 mL (0.5 mLs Intramuscular Given 10/02/23 1634)  sodium chloride  0.9 % bolus 1,000 mL (1,000 mLs Intravenous New Bag/Given 10/02/23 1738)  Procedures Procedures  (including critical care time)  Medical Decision Making / ED Course   MDM:  33 year old history of polysubstance abuse  Clinical Course as of 10/02/23 1856  Tue Oct 02, 2023  1757 Patient remains stable.  No further seizures.  CT scans without evidence of traumatic injury.  Labs notable for leukocytosis and elevated lactate which I suspect is likely due to patient's seizure rather than any underlying infectious process.  He  denies any other recent symptoms.  He received fluids for his lactic acidosis.  Will recheck. [WS]  1854 Patient declined repair of his small right posterior scalp laceration.  It is small so I think this is fine and he understands that he will likely have a larger scar.  He did have clearance of his lactate which I suspect is elevated due to his likely seizure.  He was able to ambulate without difficulty.  Feels that he is stable for discharge at this time.  He has inquired about rehab and I have provided additional outpatient resources for him to pursue.  Low concern for other cause of seizure like intracranial bleeding, infection, intracranial mass given reassuring CT scan and testing.  Feel patient is stable at this time.  Discussed seizure precautions including no driving. Will discharge patient to home. All questions answered. Patient comfortable with plan of discharge. Return precautions discussed with patient and specified on the after visit summary.  [WS]    Clinical Course User Index [WS] Francesca Elsie CROME, MD     Additional history obtained: -Additional history obtained from family and ems -External records from outside source obtained and reviewed including: Chart review including previous notes, labs, imaging, consultation notes including prior notes    Lab Tests: -I ordered, reviewed, and interpreted labs.   The pertinent results include:   Labs Reviewed  CBC WITH DIFFERENTIAL/PLATELET - Abnormal; Notable for the following components:      Result Value   WBC 20.1 (*)    Platelets 426 (*)    Neutro Abs 17.8 (*)    Abs Immature Granulocytes 0.10 (*)    All other components within normal limits  COMPREHENSIVE METABOLIC PANEL WITH GFR - Abnormal; Notable for the following components:   Glucose, Bld 128 (*)    Total Protein 8.5 (*)    Anion gap 17 (*)    All other components within normal limits  CBG MONITORING, ED - Abnormal; Notable for the following components:    Glucose-Capillary 127 (*)    All other components within normal limits  I-STAT CHEM 8, ED - Abnormal; Notable for the following components:   Glucose, Bld 136 (*)    All other components within normal limits  I-STAT CG4 LACTIC ACID, ED - Abnormal; Notable for the following components:   Lactic Acid, Venous 6.0 (*)    All other components within normal limits  I-STAT VENOUS BLOOD GAS, ED - Abnormal; Notable for the following components:   pH, Ven 7.469 (*)    pCO2, Ven 34.8 (*)    pO2, Ven 72 (*)    All other components within normal limits  ETHANOL  MAGNESIUM   PROTIME-INR  RAPID URINE DRUG SCREEN, HOSP PERFORMED  URINALYSIS, ROUTINE W REFLEX MICROSCOPIC  I-STAT CG4 LACTIC ACID, ED  SAMPLE TO BLOOD BANK    Notable for reactive leukocytosis, lactic acidosis improved on re-check   EKG   EKG Interpretation Date/Time:  Tuesday October 02 2023 16:29:31 EDT Ventricular Rate:  95 PR Interval:  177 QRS Duration:  94 QT Interval:  398 QTC Calculation: 501 R Axis:   51  Text Interpretation: Sinus rhythm Probable left atrial enlargement Prolonged QT interval Baseline wander in lead(s) II III aVF Confirmed by Francesca Fallow ([id number]) on 10/02/2023 4:58:01 PM         Imaging Studies ordered: I ordered imaging studies including CT head, CXR, CT cervical spine On my interpretation imaging demonstrates no acute injury  I independently visualized and interpreted imaging. I agree with the radiologist interpretation   Medicines ordered and prescription drug management: Meds ordered this encounter  Medications   sodium chloride  0.9 % bolus 1,000 mL   Tdap (BOOSTRIX ) injection 0.5 mL   sodium chloride  0.9 % bolus 1,000 mL    -I have reviewed the patients home medicines and have made adjustments as needed   Social Determinants of Health:  Diagnosis or treatment significantly limited by social determinants of health: obesity and polysubstance abuse   Reevaluation: After the  interventions noted above, I reevaluated the patient and found that their symptoms have improved  Co morbidities that complicate the patient evaluation  Past Medical History:  Diagnosis Date   Polysubstance abuse (HCC)       Dispostion: Disposition decision including need for hospitalization was considered, and patient discharged from emergency department.    Final Clinical Impression(s) / ED Diagnoses Final diagnoses:  Seizure after head injury [facility])     This chart was dictated using voice recognition software.  Despite best efforts to proofread,  errors can occur which can change the documentation meaning.    Francesca Fallow CROME, MD 10/02/23 [phone]

## 2023-10-02 NOTE — Discharge Instructions (Addendum)
 We evaluated you for your head injury and seizure.  Your testing in the emergency department was reassuring.  We did not see any signs of any dangerous injury.  We believe that it is safe to go home.  We think that it is unlikely that he will have any further seizures.  We have placed a referral for neurology.  We have attached some resources for rehab.  You can also go to the behavior health urgent care center as needed for detox.  We do not place people for rehab from the emergency department.  Per [place]  DMV statutes, patients with seizures are not allowed to drive until  they have been seizure-free for six months. Use caution when using heavy equipment or power tools. Avoid working on ladders or at heights. Take showers instead of baths. Ensure the water temperature is not too high on the home water heater. Do not go swimming alone. When caring for infants or small children, sit down when holding, feeding, or changing them to minimize risk of injury to the child in the event you have a seizure.   If you develop any new symptoms such as recurrent seizures, lightness or dizziness, uncontrolled vomiting, severe uncontrolled headaches, fevers, or any other new symptoms, please return to the emergency department.

## 2023-10-02 NOTE — ED Notes (Signed)
 Patient transported to CT

## 2023-10-02 NOTE — ED Notes (Signed)
 Pt able to ambulate without difficulty.

## 2023-10-02 NOTE — ED Triage Notes (Addendum)
 Pt brought to ED by EMS with a witness fall at his home on the back patio. Mother reported to EMS that she witness himk step wrong and fell and hit is head on a brick block. Mother reports he did lose consciousness and had seizure like activity. No Hx of seizures. Mom administered 2mg  of Narcan  IN. Reports to EMS he does use drugs and last known use was Saturday he had fentanyl  and he mad meth today. EMS reported initial GCS of 14. Dizziness noted when they stood him. C- collar in place. 20g IV placed to left hand. Vitals BP 178/116  114. EMS reports that vitals stayed consistently high.

## 2023-10-03 ENCOUNTER — Ambulatory Visit (HOSPITAL_COMMUNITY)
Admission: EM | Admit: 2023-10-03 | Discharge: 2023-10-03 | Disposition: A | Discharge status: Other Acute Inpatient Hospital | Payer: MEDICAID | Attending: Psychiatry | Admitting: Psychiatry

## 2023-10-03 DIAGNOSIS — F1310 Sedative, hypnotic or anxiolytic abuse, uncomplicated: Secondary | ICD-10-CM | POA: Insufficient documentation

## 2023-10-03 DIAGNOSIS — R55 Syncope and collapse: Secondary | ICD-10-CM | POA: Insufficient documentation

## 2023-10-03 DIAGNOSIS — R030 Elevated blood-pressure reading, without diagnosis of hypertension: Secondary | ICD-10-CM | POA: Insufficient documentation

## 2023-10-03 DIAGNOSIS — R569 Unspecified convulsions: Secondary | ICD-10-CM | POA: Insufficient documentation

## 2023-10-03 DIAGNOSIS — F1120 Opioid dependence, uncomplicated: Secondary | ICD-10-CM | POA: Insufficient documentation

## 2023-10-03 DIAGNOSIS — Z5900 Homelessness unspecified: Secondary | ICD-10-CM | POA: Insufficient documentation

## 2023-10-03 DIAGNOSIS — S0191XD Laceration without foreign body of unspecified part of head, subsequent encounter: Secondary | ICD-10-CM | POA: Insufficient documentation

## 2023-10-03 DIAGNOSIS — W01198D Fall on same level from slipping, tripping and stumbling with subsequent striking against other object, subsequent encounter: Secondary | ICD-10-CM | POA: Insufficient documentation

## 2023-10-03 DIAGNOSIS — F1910 Other psychoactive substance abuse, uncomplicated: Secondary | ICD-10-CM

## 2023-10-03 MED ORDER — METOPROLOL TARTRATE 25 MG PO TABS
25.0000 mg | ORAL_TABLET | Freq: Once | ORAL | Status: AC
  Administered 2023-10-03: 25 mg via ORAL
  Filled 2023-10-03: qty 1

## 2023-10-03 NOTE — ED Notes (Signed)
 Report called to Abby, Press photographer at [facility]. Safe transport notified of need to transfer to ED

## 2023-10-03 NOTE — Progress Notes (Signed)
   10/03/23 1850  [facility] Triage Screening (Walk-ins at [facility] only)  What Is the Reason for Your Visit/Call Today? PT Russell Kennedy 33Y male presents to [facility] accompanied by his mother, voluntarily. PT states he is diagnosed with anxiety and depression. PT states he self-medicates with fentanyl . PT stated he hasn't slept in approximately 4 days. PT states he was recently evicted. PT states he abuses fentanyl  and meth. PT states he fell yesterday, lost his balance, pt states he gets dizzy fast. PT states sometimes his breathing is chaotic. PT denies SI, HI, AVH and alcohol use.  Have You Recently Had Any Thoughts About Hurting Yourself? No  Are You Planning to Commit Suicide/Harm Yourself At This time? No  Have you Recently Had Thoughts About Hurting Someone [name]? No  Are You Planning To Harm Someone At This Time? No  Physical Abuse Denies  Verbal Abuse Yes, past (Comment)  Sexual Abuse Denies  Exploitation of patient/patient's resources Yes, present (Comment)  Are you currently experiencing any auditory, visual or other hallucinations? No  Have You Used Any Alcohol or Drugs in the Past 24 Hours? No (PT states he uses fentanyl , last used monday, went to methadone clinic this morning)  Do you have any current medical co-morbidities that require immediate attention?  (Head injury)  Clinician description of patient physical appearance/behavior: neat, well-groomed, shaky, intense eye contact, confused at times  What Do You Feel Would Help You the Most Today? Alcohol or Drug Use Treatment;Treatment for Depression or other mood problem;Medication(s);Social Support  Determination of Need Urgent (48 hours)  Options For Referral [facility]gent Care;Medication Management;Intensive Outpatient Therapy;Outpatient Therapy;Inpatient Hospitalization;Facility-Based Crisis;Chemical Dependency Intensive Outpatient Therapy (CDIOP)  Determination of Need filed? Yes

## 2023-10-03 NOTE — Discharge Instructions (Addendum)
  Discharge recommendations:  Please follow up with your primary care provider for all medical related needs.   Therapy: We recommend that patient participate in individual therapy to address mental health concerns.  Medications: The patient or guardian is to contact a medical professional and/or outpatient provider to address any new side effects that develop. The patient or guardian should update outpatient providers of any new medications and/or medication changes.   Safety:  The patient should abstain from use of illicit substances/drugs and abuse of any medications. If symptoms worsen or do not continue to improve or if the patient becomes actively suicidal or homicidal then it is recommended that the patient return to the closest hospital emergency department, the [facility], or call 911 for further evaluation and treatment. National Suicide Prevention Lifeline 1-800-SUICIDE or 484-091-1991.  About 988 988 offers 24/7 access to trained crisis counselors who can help people experiencing mental health-related distress. People can call or text 988 or chat 988lifeline.org for themselves or if they are worried about a loved one who may need crisis support.  Crisis Mobile: [facility]:                     618-154-1176 (for crisis response 24 hours a day) [facility] Hotline:                                            239 878 6954

## 2023-10-03 NOTE — ED Provider Notes (Signed)
 Behavioral Health Urgent Care Medical Screening Exam  Patient Name: Russell Kennedy MRN: [id number] Date of Evaluation: 10/03/23 Chief Complaint:   Diagnosis:  Final diagnoses:  Polysubstance abuse (HCC)  Homelessness  Opioid use disorder, severe, on maintenance therapy, dependence (HCC)    History of Present illness: Russell Kennedy is a 33 y.o. male.  Is a 33 year old male with psychiatric history of heroin overdose, MDD, and polysubstance abuse-opiate, benzodiazepine, alcohol, cocaine, and tobacco and medical history of chest trauma, and seizure after head injury 10/02/23.  Patient was seen face-to-face by this provider and chart reviewed.  On approach, patient is seated calm and cooperative. He endorses polysubstance abuse and reports using heroin for 10 years and then switching to snorting fentanyl  for the past 3 years.  Last use of fentanyl  was on Monday.   Patient also reports he uses methanol, last use this am, Xanax, and Klonopin which he gets off the streets, last use of both substance on monday. .  Patient reports he is established with the methadone clinic at [facility] and goes there daily for treatment.  On purpose of tonight's [facility] visit, patient reports nothing really going on today, I kinda feel fine now, I'm not really sure why I'm here, my mom brought me in, I guess to be shipped off somewhere. ,.  Patient was asked if he is looking to detox from the substances and he states no, he prefers to be discharged and will follow-up with his methadone clinic where he goes daily. He reports being homeless as he was evicted yesterday from a shared residence with his girlfriend.  He states I can't be on the premises. Patient reports he will stay at his mother's house if he has nowhere else to go.  Patient also reported sustaining a fall yesterday where he hit his head.  Per chart review, patient was evaluated and treated at [facility] yesterday for head laceration after he fell and  struck his head on the brick block.  Patient lost consciousness and had a brief seizure episode as reported by his mother who also gave him 2 mg of Narcan .  On evaluation, patient is alert, oriented x 3, and cooperative. Speech is clear, and coherent. Pt appears casually dressed. Eye contact is fair. Mood is euthymic, affect is congruent with mood. Thought process is coherent and goal directed and thought content is WDL. Pt denies SI/HI/AVH. There is no objective indication that the patient is responding to internal stimuli. No delusions elicited during this assessment.    Discussed elevated blood pressure and heart rate.  Patient denies chest pain, blurry vision or headache. Patient administered metoprolol  25 mg p.o. once for elevated blood pressure and heart rate.  Patient educated on medication, risk benefits and alternatives to treatment.  Patient verbalizes his understanding on education provided and is in agreement. Patient's blood pressure and heart rate remains elevated after manual recheck 1 hour later. Discussed recommendation for discharge and follow-up with his outpatient provider for referrals to long-term methadone residential treatment programs. Patient also provided extra resources for long-term methadone treatment and area homeless shelters as requested.   Recommend transfer to the [facility] for medical clearance. [facility] return protocols discussed.  Flowsheet Row ED from 10/02/2023 in [facility] Emergency Department at [facility] UC from 07/05/2021 in [facility] at [facility]   C-SSRS RISK CATEGORY No Risk No Risk    Psychiatric Specialty Exam  Presentation  General Appearance:Casual  Eye Contact:Fair  Speech:Clear and Coherent  Speech Volume:Normal  Handedness:Right   Mood and Affect  Mood: Euthymic  Affect: Congruent   Thought Process  Thought Processes: Goal Directed; Coherent  Descriptions of Associations:Intact  Orientation:Full (Time,  Place and Person)  Thought Content:WDL    Hallucinations:None  Ideas of Reference:None  Suicidal Thoughts:No  Homicidal Thoughts:No   Sensorium  Memory: Immediate Fair  Judgment: Poor  Insight: Shallow   Executive Functions  Concentration: Fair  Attention Span: Fair  Recall: Fair  Fund of Knowledge: Fair  Language: Fair   Psychomotor Activity  Psychomotor Activity: Normal   Assets  Assets: Manufacturing systems engineer; Desire for Improvement   Sleep  Sleep: Fair  Number of hours: No data recorded  Physical Exam: Physical Exam Constitutional:      General: He is not in acute distress. HENT:     Nose: No congestion.  Cardiovascular:     Rate and Rhythm: Tachycardia present.  Pulmonary:     Effort: No respiratory distress.  Chest:     Chest wall: No tenderness.  Neurological:     Mental Status: He is alert and oriented to person, place, and time.  Psychiatric:        Attention and Perception: Attention and perception normal.        Mood and Affect: Mood and affect normal. Mood is not anxious.        Speech: Speech normal.        Behavior: Behavior is cooperative.        Thought Content: Thought content normal.    Review of Systems  Constitutional:  Negative for chills, diaphoresis and fever.  HENT:  Negative for congestion.   Eyes:  Negative for discharge.  Respiratory:  Negative for cough, shortness of breath and wheezing.   Cardiovascular:  Negative for chest pain and palpitations.  Gastrointestinal:  Negative for diarrhea, nausea and vomiting.  Neurological:  Negative for dizziness, speech change, focal weakness, seizures, loss of consciousness and headaches.  Psychiatric/Behavioral:  Positive for substance abuse.    Blood pressure (!) 172/110, pulse (!) 108, temperature 98.2 F (36.8 C), temperature source Oral, resp. rate 16, SpO2 100%. There is no height or weight on file to calculate BMI.  Musculoskeletal: Strength & Muscle Tone:  within normal limits Gait & Station: normal Patient leans: N/A   [facility] Discharge Disposition for Follow up and Recommendations: Based on my evaluation the patient appears to have an emergency medical condition for which I recommend the patient be transferred to the emergency department for further evaluation.   Recommend transfer to [facility] for medical clearance due to sustained elevation of blood pressure and heart rate high on admission and after administration of metoprolol  25 mg p.o. x 1. I spoke with Dr. Pennel at [facility] and the provider has agreed to accept the patient. Psychiatric evaluation completed at this time and patient will follow-up with his outpatient psychiatric services at [facility] Methadone clinic in the am for assistance with long-term residential methadone treatment.  Patient also provided extra resources for long-term residential methadone therapy and area homeless shelter resources as requested.  EMTALA completed.  Thurman LULLA Ivans, NP 10/03/2023, 10:06 PM

## 2023-10-04 ENCOUNTER — Other Ambulatory Visit: Payer: Self-pay

## 2023-10-04 ENCOUNTER — Emergency Department (HOSPITAL_COMMUNITY)
Admission: EM | Admit: 2023-10-04 | Discharge: 2023-10-04 | Discharge status: Against Medical Advice | Payer: MEDICAID | Attending: Emergency Medicine | Admitting: Emergency Medicine

## 2023-10-04 ENCOUNTER — Encounter (HOSPITAL_COMMUNITY): Payer: Self-pay | Admitting: *Deleted

## 2023-10-04 DIAGNOSIS — Z5321 Procedure and treatment not carried out due to patient leaving prior to being seen by health care provider: Secondary | ICD-10-CM | POA: Diagnosis not present

## 2023-10-04 DIAGNOSIS — I10 Essential (primary) hypertension: Secondary | ICD-10-CM | POA: Diagnosis present

## 2023-10-04 NOTE — ED Notes (Signed)
 EMTALA and paperwork  returned to this nurse by [employer]

## 2023-10-04 NOTE — ED Notes (Signed)
 Pt reported to registration that he is hungry and he is leaving. Pt seen leaving the ED
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
## 2023-10-04 NOTE — ED Triage Notes (Signed)
 Pt arrives from [facility] ambulatory, sent for hypertension and tachycardia. Pt was about to begin detox program but his VS did not meet criteria although dose of Metoprolol  given there. Had fall on 9/9 and struck his head with LOC

## 2023-10-04 NOTE — ED Triage Notes (Signed)
 The pt reports that he was seen here last pm and had xrays and lab work
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
## 2023-10-05 ENCOUNTER — Emergency Department: Payer: MEDICAID

## 2023-10-05 ENCOUNTER — Other Ambulatory Visit: Payer: Self-pay

## 2023-10-05 ENCOUNTER — Inpatient Hospital Stay
Admission: EM | Admit: 2023-10-05 | Discharge: 2023-10-15 | DRG: 896 | Payer: MEDICAID | Attending: Student in an Organized Health Care Education/Training Program | Admitting: Student in an Organized Health Care Education/Training Program

## 2023-10-05 DIAGNOSIS — Z9889 Other specified postprocedural states: Secondary | ICD-10-CM

## 2023-10-05 DIAGNOSIS — G9341 Metabolic encephalopathy: Secondary | ICD-10-CM | POA: Diagnosis present

## 2023-10-05 DIAGNOSIS — Z1152 Encounter for screening for COVID-19: Secondary | ICD-10-CM

## 2023-10-05 DIAGNOSIS — F1910 Other psychoactive substance abuse, uncomplicated: Secondary | ICD-10-CM

## 2023-10-05 DIAGNOSIS — Y92231 Patient bathroom in hospital as the place of occurrence of the external cause: Secondary | ICD-10-CM | POA: Diagnosis present

## 2023-10-05 DIAGNOSIS — Z781 Physical restraint status: Secondary | ICD-10-CM

## 2023-10-05 DIAGNOSIS — S51031A Puncture wound without foreign body of right elbow, initial encounter: Secondary | ICD-10-CM | POA: Diagnosis present

## 2023-10-05 DIAGNOSIS — Z560 Unemployment, unspecified: Secondary | ICD-10-CM

## 2023-10-05 DIAGNOSIS — F10231 Alcohol dependence with withdrawal delirium: Secondary | ICD-10-CM | POA: Diagnosis present

## 2023-10-05 DIAGNOSIS — F13139 Sedative, hypnotic or anxiolytic abuse with withdrawal, unspecified: Secondary | ICD-10-CM | POA: Diagnosis present

## 2023-10-05 DIAGNOSIS — F13121 Sedative, hypnotic or anxiolytic abuse with intoxication delirium: Secondary | ICD-10-CM | POA: Diagnosis present

## 2023-10-05 DIAGNOSIS — F1113 Opioid abuse with withdrawal: Principal | ICD-10-CM | POA: Diagnosis present

## 2023-10-05 DIAGNOSIS — Z046 Encounter for general psychiatric examination, requested by authority: Principal | ICD-10-CM

## 2023-10-05 DIAGNOSIS — F32A Depression, unspecified: Secondary | ICD-10-CM | POA: Diagnosis present

## 2023-10-05 DIAGNOSIS — R443 Hallucinations, unspecified: Secondary | ICD-10-CM

## 2023-10-05 DIAGNOSIS — T402X4A Poisoning by other opioids, undetermined, initial encounter: Secondary | ICD-10-CM | POA: Diagnosis present

## 2023-10-05 DIAGNOSIS — R509 Fever, unspecified: Secondary | ICD-10-CM | POA: Diagnosis present

## 2023-10-05 DIAGNOSIS — G928 Other toxic encephalopathy: Secondary | ICD-10-CM | POA: Diagnosis present

## 2023-10-05 DIAGNOSIS — I160 Hypertensive urgency: Secondary | ICD-10-CM | POA: Diagnosis present

## 2023-10-05 DIAGNOSIS — Z5901 Sheltered homelessness: Secondary | ICD-10-CM

## 2023-10-05 DIAGNOSIS — R030 Elevated blood-pressure reading, without diagnosis of hypertension: Secondary | ICD-10-CM | POA: Diagnosis present

## 2023-10-05 DIAGNOSIS — Z555 Less than a high school diploma: Secondary | ICD-10-CM

## 2023-10-05 DIAGNOSIS — Y900 Blood alcohol level of less than 20 mg/100 ml: Secondary | ICD-10-CM | POA: Diagnosis present

## 2023-10-05 DIAGNOSIS — M6282 Rhabdomyolysis: Secondary | ICD-10-CM | POA: Diagnosis present

## 2023-10-05 DIAGNOSIS — R569 Unspecified convulsions: Secondary | ICD-10-CM | POA: Diagnosis present

## 2023-10-05 DIAGNOSIS — S01112A Laceration without foreign body of left eyelid and periocular area, initial encounter: Secondary | ICD-10-CM | POA: Diagnosis present

## 2023-10-05 DIAGNOSIS — R339 Retention of urine, unspecified: Secondary | ICD-10-CM | POA: Diagnosis not present

## 2023-10-05 DIAGNOSIS — T424X4A Poisoning by benzodiazepines, undetermined, initial encounter: Secondary | ICD-10-CM | POA: Diagnosis present

## 2023-10-05 DIAGNOSIS — F19939 Other psychoactive substance use, unspecified with withdrawal, unspecified: Secondary | ICD-10-CM | POA: Insufficient documentation

## 2023-10-05 DIAGNOSIS — E639 Nutritional deficiency, unspecified: Secondary | ICD-10-CM | POA: Diagnosis present

## 2023-10-05 DIAGNOSIS — F17210 Nicotine dependence, cigarettes, uncomplicated: Secondary | ICD-10-CM | POA: Diagnosis present

## 2023-10-05 DIAGNOSIS — F39 Unspecified mood [affective] disorder: Secondary | ICD-10-CM | POA: Insufficient documentation

## 2023-10-05 DIAGNOSIS — W182XXA Fall in (into) shower or empty bathtub, initial encounter: Secondary | ICD-10-CM | POA: Diagnosis present

## 2023-10-05 DIAGNOSIS — F1090 Alcohol use, unspecified, uncomplicated: Secondary | ICD-10-CM

## 2023-10-05 DIAGNOSIS — T510X4A Toxic effect of ethanol, undetermined, initial encounter: Secondary | ICD-10-CM | POA: Diagnosis present

## 2023-10-05 DIAGNOSIS — Z79899 Other long term (current) drug therapy: Secondary | ICD-10-CM

## 2023-10-05 DIAGNOSIS — D72829 Elevated white blood cell count, unspecified: Secondary | ICD-10-CM | POA: Diagnosis present

## 2023-10-05 DIAGNOSIS — E876 Hypokalemia: Secondary | ICD-10-CM | POA: Diagnosis present

## 2023-10-05 DIAGNOSIS — F419 Anxiety disorder, unspecified: Secondary | ICD-10-CM | POA: Diagnosis present

## 2023-10-05 DIAGNOSIS — F11121 Opioid abuse with intoxication delirium: Secondary | ICD-10-CM | POA: Diagnosis present

## 2023-10-05 LAB — URINALYSIS, COMPLETE (UACMP) WITH MICROSCOPIC
Bacteria, UA: NONE SEEN
Bilirubin Urine: NEGATIVE
Glucose, UA: NEGATIVE mg/dL
Ketones, ur: 5 mg/dL — AB
Leukocytes,Ua: NEGATIVE
Nitrite: NEGATIVE
Protein, ur: 100 mg/dL — AB
Specific Gravity, Urine: 1.023 (ref 1.005–1.030)
pH: 5 (ref 5.0–8.0)

## 2023-10-05 LAB — CBC
HCT: 41.5 % (ref 39.0–52.0)
Hemoglobin: 14 g/dL (ref 13.0–17.0)
MCH: 27.6 pg (ref 26.0–34.0)
MCHC: 33.7 g/dL (ref 30.0–36.0)
MCV: 81.9 fL (ref 80.0–100.0)
Platelets: 365 K/uL (ref 150–400)
RBC: 5.07 MIL/uL (ref 4.22–5.81)
RDW: 13.2 % (ref 11.5–15.5)
WBC: 17.3 K/uL — ABNORMAL HIGH (ref 4.0–10.5)
nRBC: 0 % (ref 0.0–0.2)

## 2023-10-05 LAB — ETHANOL: Alcohol, Ethyl (B): <15 mg/dL (ref <15)

## 2023-10-05 LAB — URINE DRUG SCREEN, QUALITATIVE (ARMC ONLY)
Amphetamines, Ur Screen: NOT DETECTED
Barbiturates, Ur Screen: NOT DETECTED
Benzodiazepine, Ur Scrn: NOT DETECTED
Cannabinoid 50 Ng, Ur ~~LOC~~: NOT DETECTED
Cocaine Metabolite,Ur ~~LOC~~: NOT DETECTED
MDMA (Ecstasy)Ur Screen: NOT DETECTED
Methadone Scn, Ur: POSITIVE — AB
Opiate, Ur Screen: NOT DETECTED
Phencyclidine (PCP) Ur S: NOT DETECTED
Tricyclic, Ur Screen: NOT DETECTED

## 2023-10-05 LAB — COMPREHENSIVE METABOLIC PANEL WITH GFR
ALT: 19 U/L (ref 0–44)
AST: 28 U/L (ref 15–41)
Albumin: 4.5 g/dL (ref 3.5–5.0)
Alkaline Phosphatase: 61 U/L (ref 38–126)
Anion gap: 14 (ref 5–15)
BUN: 12 mg/dL (ref 6–20)
CO2: 28 mmol/L (ref 22–32)
Calcium: 9.3 mg/dL (ref 8.9–10.3)
Chloride: 98 mmol/L (ref 98–111)
Creatinine, Ser: 0.95 mg/dL (ref 0.61–1.24)
GFR, Estimated: >60 mL/min (ref >60)
Glucose, Bld: 127 mg/dL — ABNORMAL HIGH (ref 70–99)
Potassium: 2.8 mmol/L — ABNORMAL LOW (ref 3.5–5.1)
Sodium: 140 mmol/L (ref 135–145)
Total Bilirubin: 1 mg/dL (ref 0.0–1.2)
Total Protein: 8.2 g/dL — ABNORMAL HIGH (ref 6.5–8.1)

## 2023-10-05 LAB — RESP PANEL BY RT-PCR (RSV, FLU A&B, COVID)  RVPGX2
Influenza A by PCR: NEGATIVE
Influenza B by PCR: NEGATIVE
Resp Syncytial Virus by PCR: NEGATIVE
SARS Coronavirus 2 by RT PCR: NEGATIVE

## 2023-10-05 LAB — MAGNESIUM: Magnesium: 2 mg/dL (ref 1.7–2.4)

## 2023-10-05 LAB — ACETAMINOPHEN LEVEL: Acetaminophen (Tylenol), Serum: <10 ug/mL — ABNORMAL LOW (ref 10–30)

## 2023-10-05 LAB — CBG MONITORING, ED: Glucose-Capillary: 103 mg/dL — ABNORMAL HIGH (ref 70–99)

## 2023-10-05 LAB — SALICYLATE LEVEL: Salicylate Lvl: <7 mg/dL — ABNORMAL LOW (ref 7.0–30.0)

## 2023-10-05 MED ORDER — LIDOCAINE HCL (PF) 1 % IJ SOLN
5.0000 mL | Freq: Once | INTRAMUSCULAR | Status: AC
  Administered 2023-10-05: 5 mL
  Filled 2023-10-05: qty 5

## 2023-10-05 MED ORDER — PHENOBARBITAL 32.4 MG PO TABS: 32.4000 mg | ORAL_TABLET | Freq: Three times a day (TID) | ORAL | Status: DC

## 2023-10-05 MED ORDER — OLANZAPINE 5 MG PO TBDP
5.0000 mg | ORAL_TABLET | Freq: Two times a day (BID) | ORAL | Status: DC
  Administered 2023-10-05 – 2023-10-06 (×3): 5 mg via ORAL
  Filled 2023-10-05 (×5): qty 1

## 2023-10-05 MED ORDER — ONDANSETRON HCL 4 MG/2ML IJ SOLN: 4.0000 mg | Freq: Four times a day (QID) | INTRAMUSCULAR | Status: DC | PRN

## 2023-10-05 MED ORDER — POTASSIUM CHLORIDE CRYS ER 20 MEQ PO TBCR
40.0000 meq | EXTENDED_RELEASE_TABLET | Freq: Two times a day (BID) | ORAL | Status: AC
Start: 2023-10-05 — End: 2023-10-07
  Administered 2023-10-05 – 2023-10-06 (×4): 40 meq via ORAL
  Filled 2023-10-05 (×4): qty 2

## 2023-10-05 MED ORDER — PHENOBARBITAL 97.2 MG PO TABS
97.2000 mg | ORAL_TABLET | Freq: Three times a day (TID) | ORAL | Status: AC
  Administered 2023-10-06 – 2023-10-07 (×4): 97.2 mg via ORAL
  Filled 2023-10-05 (×2): qty 3
  Filled 2023-10-05 (×3): qty 1

## 2023-10-05 MED ORDER — ONDANSETRON HCL 4 MG PO TABS: 4.0000 mg | ORAL_TABLET | Freq: Four times a day (QID) | ORAL | Status: DC | PRN

## 2023-10-05 MED ORDER — ACETAMINOPHEN 500 MG PO TABS
1000.0000 mg | ORAL_TABLET | Freq: Once | ORAL | Status: AC
  Administered 2023-10-05: 1000 mg via ORAL
  Filled 2023-10-05: qty 2

## 2023-10-05 MED ORDER — ACETAMINOPHEN 650 MG RE SUPP
650.0000 mg | Freq: Four times a day (QID) | RECTAL | Status: DC | PRN
  Administered 2023-10-06: 650 mg via RECTAL
  Filled 2023-10-05: qty 2

## 2023-10-05 MED ORDER — PHENOBARBITAL SODIUM 130 MG/ML IJ SOLN
130.0000 mg | Freq: Once | INTRAMUSCULAR | Status: AC
  Administered 2023-10-05: 130 mg via INTRAVENOUS
  Filled 2023-10-05: qty 1

## 2023-10-05 MED ORDER — PHENOBARBITAL 32.4 MG PO TABS: 64.8000 mg | ORAL_TABLET | Freq: Three times a day (TID) | ORAL | Status: DC

## 2023-10-05 MED ORDER — SODIUM CHLORIDE 0.9 % IV SOLN
260.0000 mg | Freq: Once | INTRAVENOUS | Status: AC
  Administered 2023-10-06: 260 mg via INTRAVENOUS
  Filled 2023-10-05: qty 2

## 2023-10-05 MED ORDER — ACETAMINOPHEN 325 MG PO TABS
650.0000 mg | ORAL_TABLET | Freq: Four times a day (QID) | ORAL | Status: DC | PRN
  Administered 2023-10-06 – 2023-10-15 (×17): 650 mg via ORAL
  Filled 2023-10-05 (×17): qty 2

## 2023-10-05 NOTE — ED Notes (Signed)
 This tech and [place], [place], performed complete linen change. Pt paper scrubs removed, brief and chux placed on pt  due to pt incontinence and noncompliance to alert tech of need to urinate. This tech noticed pt temperature was warm, and [place], NT, currently taking vitals on pt at bedside. Both techs also repeatedly asking pt if he is hungry or wants a snack, but pt not able to respond.
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
## 2023-10-05 NOTE — ED Notes (Signed)
 In and out cath performed at this time to obtain a urine sample.

## 2023-10-05 NOTE — ED Notes (Signed)
 Pt urinated on self and bed. This tech cleaned bed, put new linen on bed, and assisted pt with changing into new scrubs. Sitter remains 1:1 with pt.

## 2023-10-05 NOTE — Assessment & Plan Note (Signed)
 SBP in the 180s without a history of hypertension and could be related to agitation and withdrawal symptoms Continue to monitor Hydralazine  as needed

## 2023-10-05 NOTE — ED Notes (Signed)
 Patient reporting visual hallucinations of football players. Gooseflesh skin also noted. MD Paduchowski made aware. See new orders.

## 2023-10-05 NOTE — ED Notes (Signed)
 During handoff, pt went into bathroom and turned on shower. Once staff realized shower was running, RN and tech went to door to get pt to turn shower off. Pt was standing against door and would not let staff in. Pt asked several times to step away from door. When door was opened, pt was standing close to door and fell down due to floors being wet. Pt fell onto knees and likely against wall. Pt has minor laceration to L eyebrow with minimal bleeding that was controlled with pressure. Pt also suffered minimal laceration to R elbow, bleeding controlled. Pt still not answering questions about what he was doing, pt keeps repeating to staff, I'm fine. MD Ernest made aware of fall to evaluate pt. Pt did not have LOC. Pt was dried off, taken to room, changed into clean scrubs and instructed on importance of cooperating with staff for his safety and ours.

## 2023-10-05 NOTE — ED Notes (Signed)
 This RN at bedside d/t bed alarm. Pt pacing room. Redirected back to sit on bed. Continues to be disoriented. Charge RN aware, Recruitment consultant at bedside

## 2023-10-05 NOTE — ED Notes (Signed)
 Bed alarm in place, fall risk bracelet placed, door to remain open.

## 2023-10-05 NOTE — ED Notes (Signed)
 Dinner tray provided to pt

## 2023-10-05 NOTE — Assessment & Plan Note (Signed)
 Acute drug withdrawal with complication Acute alcohol withdrawal with delirium/hallucinations Polysubstance abuse Alcohol use disorder Patient reportedly stopped all substances approximately some days prior and since then became agitated and started hallucinating Will continue phenobarb Continue IVC, continue sitter Will hold further Zyprexa  until fever resolved in case of low chance of NMS
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
## 2023-10-05 NOTE — ED Notes (Signed)
Unable to get bloodwork at this time 

## 2023-10-05 NOTE — Assessment & Plan Note (Signed)
 Potassium 2.8 on arrival, receiving oral repletion in the ED Pharmacy consult for electrolyte management
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
## 2023-10-05 NOTE — ED Notes (Signed)
 This tech received report from previous tech that pt has not needed to urinate. I asked the pt again if he needs to urinate in order to attempt to collect a urine sample, and pt continues to shake head and say no.

## 2023-10-05 NOTE — ED Notes (Signed)
Pt to CT with security escort ?
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
## 2023-10-05 NOTE — Assessment & Plan Note (Signed)
 Possible SIRS Patient with isolated fever and otherwise normal vitals Doubt NMS given low-grade temp, one-time dose of olanzapine  in the ED, no muscle stiffness Low suspicion for meningitis/encephalitis-no meningeal signs on exam Could be SIRS related to agitation Given history of substance use will get blood cultures No identifiable source of infection and low suspicion for meningitis We will keep n.p.o. due to somnolence and in case of decision for diagnostic workup with LP Holding off on antibiotics for now--> procalcitonin<0.1, lactic acid 0.8

## 2023-10-05 NOTE — ED Notes (Signed)
 This RN to bedside d/t bed alarm, pt standing beside bed. Disoriented. Pt redirected back to bed. Bed alarm in place. Stretcher locked in lowest position

## 2023-10-05 NOTE — ED Notes (Signed)
 Pt taking off monitors.

## 2023-10-05 NOTE — ED Provider Notes (Signed)
-----------------------------------------   9:24 PM on 10/05/2023 ----------------------------------------- Patient now has a low-grade temperature of 100.6.  Patient's workup did show moderate leukocytosis but no other significant finding.  Respiratory panel is negative.  Initial chest x-ray was negative.  Will repeat a chest x-ray as a precaution.  Patient is hypertensive 189/104.  Patient now with visual hallucinations as well highly suspect benzodiazepine withdrawal to be the cause.  Unclear source of fever.  We will attempt an In-N-Out cath for urine sample in addition to the repeat chest x-ray.  We will dose IV phenobarbital  for the patient's withdrawal symptoms.  Given his visual hallucinations I believe the patient will require admission for further workup and treatment.  In reviewing the patient's chart he also has a history of alcohol use disorder this could be related to alcohol withdrawal as well.  Patient did receive a one-time dose of olanzapine  this morning, I believe NMS would be much less likely, no diaphoresis no rigidity, however we will continue to monitor and if his fever significantly elevates this would have to be revisited.  Repeat chest x-ray remains negative. Urinalysis shows no obvious infection.  Patient has defervesced to 98.3.  Will admit to the hospital service for further workup and treatment given his complicated withdrawal now with visual hallucinations.   Dorothyann Drivers, MD 10/05/23 2251

## 2023-10-05 NOTE — ED Notes (Signed)
 EDT [place] notified this RN that patient refused his dinner tray.

## 2023-10-05 NOTE — ED Notes (Signed)
 Pt restless and anxious, requesting methadone. Pt informed that he will need to provide a urine sample and be sober before anything. Pt still with word salad, although it has improved since arrival to room.

## 2023-10-05 NOTE — Discharge Instructions (Addendum)
 Sutures need to be removed in 10 to 14 days of this elbow. Sutures placed on 9/12  Your nurse navigator, Pola Furno, can be reached at [phone]  PCP/ Clinic [facility] [address] [place], [place] [address] [phone]    [facility] [address] [place], [place] [address] [phone]  [facility] [address] [place], [place] [address] Phone #: [phone]  [facility] [address] [place], [place] [address] [phone]  Use this link to locate free and charitable clinics-   CheatPrevention.com.au  [facility] [address] [place], [place] [address]  [phone]  [facility] [address] [place], [place] [address] [phone]  [facility] [address] [place], [place] [address] Main [phone]  [facility] [address] [place], [place] [address] [phone]  Substance Use Resources http://lester.info/  [facility] is a free, confidential, 24/7, 365-day-a-year treatment referral and information service (in [place] and [place]) for individuals and families facing mental and/or substance use disorders.  1-800-662-HELP (4357)  If you need to talk, the 104 Lifeline is here. At the 988 Su[facility] Crisis Lif[facility], we understand that life's challenges can sometimes be difficult. Whether you're facing mental health struggles, emotional distress, alcohol or drug use concerns, or just need someone to talk to, our caring counselors are here for you. You are not alone. For immediate help from a caring, skilled counselor, please call or text 988 or chat with us . For life-threatening emergencies, call 911 and ask for a [employer] Estate agent. They have special training.  [facility] Crisis Management services provide intensive,  on-site response, stabilization, and intervention for individuals of all ages who are experiencing a crisis related to mental health disturbances, developmental disabilities, or addiction. Our team of behavioral health professionals is available 24/7/365 to provide confidential and secure stabilization services for individuals in their homes, workplaces, schools, or anywhere within the community where a crisis occurs: Armed forces technical officer Crisis symptoms may include: Hearing voices Experiencing hallucinations Displaying irrational behavior Expressing intent to harm oneself or others Intravenous (IV) drug use Withdrawal from illegal substance use Using drugs while pregnant Becoming unmanageable due to mental illness Call [phone] for [facility].  [facility] [employer] [facility] Behavioral health providers Family members & self-referrals Medicaid ([place], [employer], [place], Partners, Tourist information centre manager, and Blue Cross Blue Shield - Healthy Blue) For admission inquiries: [facility] Crisis Services: [phone] [facility]: [phone] [address] [facility]: [phone] Main Location: [address] [place], [place] [address] Admission: [phone] https://reed.biz/  [facility] [address] [place], [place], [place] [address] Phone: [phone]  [facility] [address] [place], [place] [address] [phone]   Al[facility] and [facility] [address] [place], [address] [place], [place] [address] [phone]  [facility] Health Crisis Line 24 hours a day, 7 days a week [phone]  [facility], 24/7 [facility] [address], [place], [place] [address] [phone]

## 2023-10-05 NOTE — ED Notes (Signed)
 During shift change/report, this RN called to quad, patient had ambulated into bathroom and turned on shower. This RN attempted to open door but patient pushes door shut. Multiple attempts made to enter bathroom without force, but unsuccessful. This RN with assistance of quad officer, forced entry into bathroom, causing patient to fall to floor. This RN with help of other staff assisted patient back to room, dried pt off with towels and redressed into red scrubs. Blood noted and lac found to left eyebrow and to right elbow. Dressing applied to right elbow and ER provider called to bedside to evaluate. Dermabond applied to brow lac by provider. Lac to elbow was cleaned and irrigated by this RN. Provider has concerns for joint injury from lac and lac appears deep. Will CT elbow prior to suturing.

## 2023-10-05 NOTE — ED Notes (Signed)
 Full rainbow sent to lab.

## 2023-10-05 NOTE — H&P (Signed)
 History and Physical    Patient: Russell Kennedy FMW:[id number] DOB: 02-Apr-1990 DOA: 10/05/2023 DOS: the patient was seen and examined on 10/05/2023 PCP: Patient, No Pcp Per  Patient coming from: Home  Chief Complaint:  Chief Complaint  Patient presents with   Psychiatric Evaluation    HPI: Russell Kennedy is a 33 y.o. male with medical history significant for Polysubstance abuse including opioid use disorder as well as alcohol use disorder who arrived to the ED today and in the day under IVC due to aggressive behavior and hallucinations being admitted with a fever of uncertain etiology.  Patient was tachycardic and hypertensive(SBP 170s to 180s) on arrival and was initially treated for acute withdrawal. Toxicology revealed EtOH less than 15 and methadone on UDS.  Salicylate less than 7 CBC and CMP notable for WBC 17,000 and potassium 2.8 Urinalysis with ketones and without infection respiratory panel negative. Patient was started on phenobarbital  for withdrawal in the ED. He was also evaluated by psych and started on olanzapine  He received potassium repletion while in the ED Later in the evening on 9/12, he developed a temperature of 100.6.  He had no muscular rigidity concerning for NMS Chest x-ray was clear Patient had prior trauma imaging for fall that included CT head, C-spine, CT right elbow which were all nonacute. Admission requested for fever workup  Patient is currently somnolent from medications administered in the ED and is unable to contribute to history    Past Medical History:  Diagnosis Date   Polysubstance abuse [facility])    Past Surgical History:  Procedure Laterality Date   ROTATOR CUFF REPAIR     Social History:  reports that he has been smoking cigarettes. He has never used smokeless tobacco. He reports that he does not currently use alcohol. He reports current drug use.  No Known Allergies  History reviewed. No pertinent family history.  Prior to Admission  medications   Medication Sig Start Date End Date Taking? Authorizing Provider  ibuprofen  (ADVIL ,MOTRIN ) 600 MG tablet Take 1 tablet by mouth 2 (two) times daily as needed. 07/19/17  Yes [provider]  Buprenorphine  HCl-Naloxone  HCl 8-2 MG FILM Place 1 Film under the tongue 2 (two) times daily. Patient not taking: Reported on 10/05/2023 08/21/17   [provider]  doxepin (SINEQUAN) 25 MG capsule Take 1 capsule by mouth daily. Patient not taking: Reported on 10/05/2023 07/18/17   [provider]  escitalopram  (LEXAPRO ) 20 MG tablet Take 20 mg by mouth daily. Patient not taking: Reported on 10/05/2023 08/21/17   [provider]  hydrOXYzine (VISTARIL) 50 MG capsule Take 1 capsule by mouth at bedtime.  Patient not taking: Reported on 10/05/2023 07/05/17   [provider]    Physical Exam: Vitals:   10/05/23 1243 10/05/23 1414 10/05/23 2038 10/05/23 2203  BP:  (!) 156/126 (!) 189/104   Pulse:  78 81   Resp:  17 20   Temp: 98.6 F (37 C) 99 F (37.2 C) (!) 100.6 F (38.1 C) 98.3 F (36.8 C)  TempSrc: Oral Oral Oral Oral  SpO2:  100% 100%   Weight:      Height:       Physical Exam Vitals and nursing note reviewed.  Constitutional:      General: He is not in acute distress.    Comments: Somnolent but arousable  HENT:     Head: Normocephalic and atraumatic.  Cardiovascular:     Rate and Rhythm: Normal rate and regular rhythm.  Heart sounds: Normal heart sounds.  Pulmonary:     Effort: Pulmonary effort is normal.     Breath sounds: Normal breath sounds.  Abdominal:     Palpations: Abdomen is soft.     Tenderness: There is no abdominal tenderness.  Neurological:     General: No focal deficit present.     Labs on Admission: I have personally reviewed following labs and imaging studies  CBC: Recent Labs  Lab 10/02/23 1523 10/02/23 1603 10/05/23 0356  WBC  --  20.1* 17.3*  NEUTROABS  --  17.8*  --   HGB 15.0  15.6 14.8 14.0   HCT 44.0  46.0 44.9 41.5  MCV  --  81.5 81.9  PLT  --  426* 365   Basic Metabolic Panel: Recent Labs  Lab 10/02/23 1523 10/02/23 1603 10/05/23 0331 10/05/23 0356  NA 138  138 137  --  140  K 4.0  4.0 3.5  --  2.8*  CL 101 98  --  98  CO2  --  22  --  28  GLUCOSE 136* 128*  --  127*  BUN 13 10  --  12  CREATININE 1.00 1.08  --  0.95  CALCIUM  --  9.9  --  9.3  MG  --  2.1 2.0  --    GFR: Estimated Creatinine Clearance: 170.4 mL/min (by C-G formula based on SCr of 0.95 mg/dL). Liver Function Tests: Recent Labs  Lab 10/02/23 1603 10/05/23 0356  AST 34 28  ALT 21 19  ALKPHOS 68 61  BILITOT 0.7 1.0  PROT 8.5* 8.2*  ALBUMIN 4.3 4.5   No results for input(s): LIPASE, AMYLASE in the last 168 hours. No results for input(s): AMMONIA in the last 168 hours. Coagulation Profile: Recent Labs  Lab 10/02/23 1601  INR 1.0   Cardiac Enzymes: No results for input(s): CKTOTAL, CKMB, CKMBINDEX, TROPONINI in the last 168 hours. BNP (last 3 results) No results for input(s): PROBNP in the last 8760 hours. HbA1C: No results for input(s): HGBA1C in the last 72 hours. CBG: Recent Labs  Lab 10/02/23 1517 10/05/23 2058  GLUCAP 127* 103*   Lipid Profile: No results for input(s): CHOL, HDL, LDLCALC, TRIG, CHOLHDL, LDLDIRECT in the last 72 hours. Thyroid Function Tests: No results for input(s): TSH, T4TOTAL, FREET4, T3FREE, THYROIDAB in the last 72 hours. Anemia Panel: No results for input(s): VITAMINB12, FOLATE, FERRITIN, TIBC, IRON, RETICCTPCT in the last 72 hours. Urine analysis:    Component Value Date/Time   COLORURINE YELLOW (A) 10/05/2023 2119   APPEARANCEUR HAZY (A) 10/05/2023 2119   LABSPEC 1.023 10/05/2023 2119   PHURINE 5.0 10/05/2023 2119   GLUCOSEU NEGATIVE 10/05/2023 2119   HGBUR SMALL (A) 10/05/2023 2119   BILIRUBINUR NEGATIVE 10/05/2023 2119   KETONESUR 5 (A) 10/05/2023 2119   PROTEINUR 100 (A)  10/05/2023 2119   NITRITE NEGATIVE 10/05/2023 2119   LEUKOCYTESUR NEGATIVE 10/05/2023 2119    Radiological Exams on Admission: DG Chest Portable 1 View Result Date: 10/05/2023 CLINICAL DATA:  Fever EXAM: PORTABLE CHEST 1 VIEW COMPARISON:  Chest x-ray 10/05/2023 FINDINGS: The heart size and mediastinal contours are within normal limits. Both lungs are clear. The visualized skeletal structures are unremarkable. IMPRESSION: No active disease. Electronically Signed   By: Greig Pique M.D.   On: 10/05/2023 21:03   CT Elbow Right Wo Contrast Result Date: 10/05/2023 CLINICAL DATA:  Elbow laceration after fall. EXAM: CT OF THE UPPER RIGHT EXTREMITY WITHOUT CONTRAST TECHNIQUE: Multidetector CT imaging  of the upper right extremity was performed according to the standard protocol. RADIATION DOSE REDUCTION: This exam was performed according to the departmental dose-optimization program which includes automated exposure control, adjustment of the mA and/or kV according to patient size and/or use of iterative reconstruction technique. COMPARISON:  None Available. FINDINGS: Bones/Joint/Cartilage No fracture or dislocation. Normal alignment. No sizable joint effusion. Ligaments Ligaments are suboptimally evaluated by CT. Muscles and Tendons Muscles are normal. No muscle atrophy. No intramuscular fluid collection or hematoma. Soft tissue Cutaneous irregularity along the posterior elbow likely corresponds to site of reported laceration with associated subcutaneous edema overlying the olecranon. No radiopaque foreign body. No loculated fluid collection. IMPRESSION: 1. Cutaneous irregularity along the posterior elbow likely corresponds to site of reported laceration with associated subcutaneous edema overlying the olecranon. 2. No acute osseous abnormality. Electronically Signed   By: Harrietta Sherry M.D.   On: 10/05/2023 09:37   CT Cervical Spine Wo Contrast Result Date: 10/05/2023 EXAM: CT CERVICAL SPINE WITHOUT  CONTRAST 10/05/2023 08:02:28 AM TECHNIQUE: CT of the cervical spine was performed without the administration of intravenous contrast. Multiplanar reformatted images are provided for review. Automated exposure control, iterative reconstruction, and/or weight based adjustment of the mA/kV was utilized to reduce the radiation dose to as low as reasonably achievable. COMPARISON: Earlier same day CT cervical spine. CLINICAL HISTORY: Bone lesion, cervical spine, incidental. Patient fall with laceration to elbow. Repeat performed to reduce motion. FINDINGS: CERVICAL SPINE: BONES AND ALIGNMENT: Straightening and slight reversal of the normal cervical lordosis. No listhesis. No facet subluxation or dislocation. DEGENERATIVE CHANGES: No significant degenerative changes. SOFT TISSUES: No prevertebral soft tissue swelling. IMPRESSION: 1. No acute abnormality of the cervical spine related to the fall. Electronically signed by: Donnice Mania MD 10/05/2023 08:42 AM EDT RP Workstation: [id number]   DG Chest Portable 1 View Result Date: 10/05/2023 CLINICAL DATA:  33 year old male with altered mental status, agitated, leukocytosis. EXAM: PORTABLE CHEST 1 VIEW COMPARISON:  Portable chest 10/02/2023 and earlier. FINDINGS: Portable AP view at 0834 hours. Low but mildly improved lung volumes. Normal cardiac size and mediastinal contours. Visualized tracheal air column is within normal limits. Allowing for portable technique the lungs are clear. Negative visible bowel gas and osseous structures. IMPRESSION: Negative portable chest. Electronically Signed   By: VEAR Hurst M.D.   On: 10/05/2023 08:41   CT HEAD WO CONTRAST ( ) Result Date: 10/05/2023 EXAM: CT HEAD WITHOUT CONTRAST 10/05/2023 08:02:28 AM TECHNIQUE: CT of the head was performed without the administration of intravenous contrast. Automated exposure control, iterative reconstruction, and/or weight based adjustment of the mA/kV was utilized to reduce the radiation dose to as  low as reasonably achievable. COMPARISON: 10/05/2023 CLINICAL HISTORY: Headache, fever. Patient fall with laceration to elbow. FINDINGS: BRAIN AND VENTRICLES: No acute hemorrhage. No evidence of acute infarct. No hydrocephalus. No extra-axial collection. No mass effect or midline shift. ORBITS: No acute abnormality. SINUSES: Mucous retention cyst in the right maxillary sinus. Additional mucosal thickening and mucous retention cyst resulting in near complete opacification of the left maxillary sinus. Similar defect to the anterior nasal septum. SOFT TISSUES AND SKULL: Mild asymmetric soft tissue thickening in the right temporoparietal scalp is similar to multiple prior studies. Recommend correlation with physical exam findings. No skull fracture. IMPRESSION: 1. No acute intracranial abnormality. 2. Mild asymmetric soft tissue thickening in the right temporoparietal scalp, similar to recent prior studies. Recommend correlation with physical exam findings. Electronically signed by: Donnice Mania MD 10/05/2023 08:21 AM EDT RP Workstation: [id number]  CT Cervical Spine Wo Contrast Result Date: 10/05/2023 CLINICAL DATA:  33 year old male with altered mental status, agitated,. Withdrawing from fentanyl  and xanax. EXAM: CT CERVICAL SPINE WITHOUT CONTRAST TECHNIQUE: Multidetector CT imaging of the cervical spine was performed without intravenous contrast. Multiplanar CT image reconstructions were also generated. RADIATION DOSE REDUCTION: This exam was performed according to the departmental dose-optimization program which includes automated exposure control, adjustment of the mA and/or kV according to patient size and/or use of iterative reconstruction technique. COMPARISON:  Head CT today.  Cervical spine CT 10/02/2023. FINDINGS: Alignment: Mild reversal of cervical lordosis is new. Cervicothoracic junction alignment is within normal limits. Bilateral posterior element alignment is within normal limits. Skull base and  vertebrae: Bone mineralization is within normal limits. Visualized skull base is intact. No atlanto-occipital dissociation. C1 and C2 appear intact and aligned. No acute osseous abnormality identified. Soft tissues and spinal canal: No prevertebral fluid or swelling. No visible canal hematoma. Negative visible noncontrast neck soft tissues. Disc levels: Intermittent cervical mostly anterior endplate spurring. No CT evidence of cervical spinal stenosis. Upper chest: Negative visible noncontrast thoracic inlet. Other: Chronic left maxillary sinus retention cyst. IMPRESSION: No acute traumatic injury identified in the cervical spine. Electronically Signed   By: VEAR Hurst M.D.   On: 10/05/2023 05:08   CT HEAD WO CONTRAST ( ) Result Date: 10/05/2023 CLINICAL DATA:  33 year old male with altered mental status, agitated,. Withdrawing from fentanyl  and xanax. EXAM: CT HEAD WITHOUT CONTRAST TECHNIQUE: Contiguous axial images were obtained from the base of the skull through the vertex without intravenous contrast. RADIATION DOSE REDUCTION: This exam was performed according to the departmental dose-optimization program which includes automated exposure control, adjustment of the mA and/or kV according to patient size and/or use of iterative reconstruction technique. COMPARISON:  Head CT 10/02/2023. FINDINGS: Brain: Cerebral volume remains normal. No midline shift, ventriculomegaly, mass effect, evidence of mass lesion, intracranial hemorrhage or evidence of cortically based acute infarction. Gray-white matter differentiation is within normal limits throughout the brain. Vascular: No suspicious intracranial vascular hyperdensity. Skull: No acute osseous abnormality identified. Sinuses/Orbits: Left maxillary sinus retention cyst redemonstrated. Other Visualized paranasal sinuses and mastoids are clear. Other: Visualized orbits and scalp soft tissues are within normal limits. IMPRESSION: Stable and normal noncontrast CT  appearance of the brain. Electronically Signed   By: VEAR Hurst M.D.   On: 10/05/2023 05:06   Data Reviewed for HPI: Relevant notes from primary care and specialist visits, past discharge summaries as available in EHR, including Care Everywhere. Prior diagnostic testing as pertinent to current admission diagnoses Updated medications and problem lists for reconciliation ED course, including vitals, labs, imaging, treatment and response to treatment Triage notes, nursing and pharmacy notes and ED provider's notes Notable results as noted above in HPI      Assessment and Plan: * Acute toxic-metabolic encephalopathy Acute drug withdrawal with complication Acute alcohol withdrawal with delirium/hallucinations Polysubstance abuse Alcohol use disorder Patient reportedly stopped all substances approximately some days prior and since then became agitated and started hallucinating Will continue phenobarb Continue IVC, continue sitter Will hold further Zyprexa  until fever resolved in case of low chance of NMS    Fever of undetermined origin Possible SIRS Patient with isolated fever and otherwise normal vitals Doubt NMS given low-grade temp, one-time dose of olanzapine  in the ED, no muscle stiffness Could be SIRS related to agitation Given history of substance use will get blood cultures No identifiable source of infection and low suspicion for meningitis We will keep n.p.o.  after midnight in case of need for LP Holding off on antibiotics for now   Hypokalemia Potassium 2.8 on arrival, receiving oral repletion in the ED Pharmacy consult for electrolyte management  Elevated blood pressure reading without diagnosis of hypertension SBP in the 180s without a history of hypertension and could be related to agitation and withdrawal symptoms Continue to monitor        DVT prophylaxis: SCD  Consults: none  Advance Care Planning:   Code Status: Full Code   Family Communication:  none  Disposition Plan: Back to previous home environment  Severity of Illness: The appropriate patient status for this patient is OBSERVATION. Observation status is judged to be reasonable and necessary in order to provide the required intensity of service to ensure the patient's safety. The patient's presenting symptoms, physical exam findings, and initial radiographic and laboratory data in the context of their medical condition is felt to place them at decreased risk for further clinical deterioration. Furthermore, it is anticipated that the patient will be medically stable for discharge from the hospital within 2 midnights of admission.   Author: Delayne LULLA Solian, MD 10/05/2023 11:48 PM  For on call review [email or web address].

## 2023-10-05 NOTE — ED Notes (Signed)
 Lunch tray provided to pt.

## 2023-10-05 NOTE — ED Provider Notes (Addendum)
 7:54 AM Assumed care for off going team.   Blood pressure (!) 145/98, pulse (!) 109, temperature 98.4 F (36.9 C), temperature source Oral, resp. rate (!) 21, height 6' 5 (1.956 m), weight 136.1 kg, SpO2 99%.  See their HPI for full report but in brief pending workup for his elevated white count.  Just after shift change patient had a fall in which patient had refused to open the door in the shower.  Patient had a mechanical fall.  My evaluation patient has a small X shaped laceration on on the left eyebrow however it is very superficial in nature therefore some glue was placed on it.  He also had a puncture like wound on the right elbow.  I was able to use some lidocaine  to numb of the area inspected the area with a tweezers and I did look about 1 cm deep and therefore I considered the possibility of possibly an open joint therefore I will get a CT scan without contrast to ensure there is no air in the joint before suturing this closed.  CT imaging was also ordered of the head and neck to evaluate for any injuries here.  Patient had a tetanus shot that was recently updated.  I reevaluated patient his abdomen is soft and nontender low suspicion for acute abdominal process.   Patient does have an old wound noted on the back of his head where the CT scans that recommends to correlate with physical exam.  .Laceration Repair  Date/Time: 10/05/2023 7:57 AM  Performed by: Ernest Ronal BRAVO, MD Authorized by: Ernest Ronal BRAVO, MD   Consent:    Consent obtained:  Emergent situation Laceration details:    Location:  Face   Face location:  L eyebrow   Length (cm):  1 Treatment:    Area cleansed with:  Saline Skin repair:    Repair method:  Tissue adhesive Approximation:    Approximation:  Close Repair type:    Repair type:  Simple Post-procedure details:    Dressing:  Open (no dressing)   Procedure completion:  Tolerated well, no immediate complications .Laceration Repair  Date/Time: 10/05/2023  12:29 PM  Performed by: Ernest Ronal BRAVO, MD Authorized by: Ernest Ronal BRAVO, MD   Consent:    Consent obtained:  Emergent situation Laceration details:    Location:  Shoulder/arm   Shoulder/arm location:  L elbow   Length (cm):  2 Treatment:    Area cleansed with:  Povidone-iodine   Irrigation volume:  50cc Skin repair:    Repair method:  Sutures   Suture size:  5-0   Suture material:  Prolene   Number of sutures:  2 Approximation:    Approximation:  Close Repair type:    Repair type:  Simple   9:58 AM did confirm with radiology dr marcelino there is no fracture or concern for air in the joint therefore will repair the laceration on his elbow  -2 sutures were placed here patient was instructed that these would need to be removed in about 10 days-14 days.    Called to try to add on the urine but it sounds that patient has to have another sample and the nurses aware.  Patient handed off pending this   Ernest Ronal BRAVO, MD 10/05/23 1232    Ernest Ronal BRAVO, MD 10/05/23 1537

## 2023-10-05 NOTE — ED Notes (Signed)
 Pt belongings:  Tan shoes Grey socks Blue tshirt [employer]

## 2023-10-05 NOTE — ED Notes (Signed)
Provider at bedside with lac cart
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
## 2023-10-05 NOTE — ED Notes (Signed)
Pt given breakfast tray and beverage.  

## 2023-10-05 NOTE — ED Notes (Signed)
 Pt dressed out at this time into scrubs with this nt, rn, and Psychologist, forensic

## 2023-10-06 DIAGNOSIS — G928 Other toxic encephalopathy: Secondary | ICD-10-CM | POA: Diagnosis present

## 2023-10-06 DIAGNOSIS — Y900 Blood alcohol level of less than 20 mg/100 ml: Secondary | ICD-10-CM | POA: Diagnosis present

## 2023-10-06 DIAGNOSIS — E876 Hypokalemia: Secondary | ICD-10-CM | POA: Diagnosis present

## 2023-10-06 DIAGNOSIS — F10231 Alcohol dependence with withdrawal delirium: Secondary | ICD-10-CM | POA: Diagnosis present

## 2023-10-06 DIAGNOSIS — S51031A Puncture wound without foreign body of right elbow, initial encounter: Secondary | ICD-10-CM | POA: Diagnosis present

## 2023-10-06 DIAGNOSIS — M6282 Rhabdomyolysis: Secondary | ICD-10-CM | POA: Diagnosis present

## 2023-10-06 DIAGNOSIS — F419 Anxiety disorder, unspecified: Secondary | ICD-10-CM | POA: Diagnosis present

## 2023-10-06 DIAGNOSIS — F1910 Other psychoactive substance abuse, uncomplicated: Secondary | ICD-10-CM | POA: Diagnosis not present

## 2023-10-06 DIAGNOSIS — Z1152 Encounter for screening for COVID-19: Secondary | ICD-10-CM | POA: Diagnosis not present

## 2023-10-06 DIAGNOSIS — W182XXA Fall in (into) shower or empty bathtub, initial encounter: Secondary | ICD-10-CM | POA: Diagnosis present

## 2023-10-06 DIAGNOSIS — R443 Hallucinations, unspecified: Secondary | ICD-10-CM | POA: Diagnosis present

## 2023-10-06 DIAGNOSIS — Z5901 Sheltered homelessness: Secondary | ICD-10-CM | POA: Diagnosis not present

## 2023-10-06 DIAGNOSIS — F32A Depression, unspecified: Secondary | ICD-10-CM | POA: Diagnosis present

## 2023-10-06 DIAGNOSIS — Y92231 Patient bathroom in hospital as the place of occurrence of the external cause: Secondary | ICD-10-CM | POA: Diagnosis present

## 2023-10-06 DIAGNOSIS — F11121 Opioid abuse with intoxication delirium: Secondary | ICD-10-CM | POA: Diagnosis present

## 2023-10-06 DIAGNOSIS — I160 Hypertensive urgency: Secondary | ICD-10-CM | POA: Diagnosis present

## 2023-10-06 DIAGNOSIS — T510X4A Toxic effect of ethanol, undetermined, initial encounter: Secondary | ICD-10-CM | POA: Diagnosis present

## 2023-10-06 DIAGNOSIS — R509 Fever, unspecified: Secondary | ICD-10-CM | POA: Diagnosis present

## 2023-10-06 DIAGNOSIS — F13139 Sedative, hypnotic or anxiolytic abuse with withdrawal, unspecified: Secondary | ICD-10-CM | POA: Diagnosis present

## 2023-10-06 DIAGNOSIS — D72829 Elevated white blood cell count, unspecified: Secondary | ICD-10-CM | POA: Diagnosis present

## 2023-10-06 DIAGNOSIS — F13121 Sedative, hypnotic or anxiolytic abuse with intoxication delirium: Secondary | ICD-10-CM | POA: Diagnosis present

## 2023-10-06 DIAGNOSIS — R569 Unspecified convulsions: Secondary | ICD-10-CM | POA: Diagnosis present

## 2023-10-06 DIAGNOSIS — G9341 Metabolic encephalopathy: Secondary | ICD-10-CM | POA: Diagnosis not present

## 2023-10-06 DIAGNOSIS — F1113 Opioid abuse with withdrawal: Secondary | ICD-10-CM | POA: Diagnosis present

## 2023-10-06 DIAGNOSIS — F17210 Nicotine dependence, cigarettes, uncomplicated: Secondary | ICD-10-CM | POA: Diagnosis present

## 2023-10-06 DIAGNOSIS — T402X4A Poisoning by other opioids, undetermined, initial encounter: Secondary | ICD-10-CM | POA: Diagnosis present

## 2023-10-06 DIAGNOSIS — R339 Retention of urine, unspecified: Secondary | ICD-10-CM | POA: Diagnosis not present

## 2023-10-06 DIAGNOSIS — T424X4A Poisoning by benzodiazepines, undetermined, initial encounter: Secondary | ICD-10-CM | POA: Diagnosis present

## 2023-10-06 DIAGNOSIS — S01112A Laceration without foreign body of left eyelid and periocular area, initial encounter: Secondary | ICD-10-CM | POA: Diagnosis present

## 2023-10-06 LAB — BASIC METABOLIC PANEL WITH GFR
Anion gap: 17 — ABNORMAL HIGH (ref 5–15)
Anion gap: 15 (ref 5–15)
BUN: 12 mg/dL (ref 6–20)
BUN: 12 mg/dL (ref 6–20)
CO2: 23 mmol/L (ref 22–32)
CO2: 25 mmol/L (ref 22–32)
Calcium: 9.1 mg/dL (ref 8.9–10.3)
Calcium: 9.3 mg/dL (ref 8.9–10.3)
Chloride: 100 mmol/L (ref 98–111)
Chloride: 101 mmol/L (ref 98–111)
Creatinine, Ser: 0.71 mg/dL (ref 0.61–1.24)
Creatinine, Ser: 0.77 mg/dL (ref 0.61–1.24)
GFR, Estimated: >60 mL/min (ref >60)
GFR, Estimated: >60 mL/min (ref >60)
Glucose, Bld: 115 mg/dL — ABNORMAL HIGH (ref 70–99)
Glucose, Bld: 124 mg/dL — ABNORMAL HIGH (ref 70–99)
Potassium: 3.2 mmol/L — ABNORMAL LOW (ref 3.5–5.1)
Potassium: 3.3 mmol/L — ABNORMAL LOW (ref 3.5–5.1)
Sodium: 140 mmol/L (ref 135–145)
Sodium: 141 mmol/L (ref 135–145)

## 2023-10-06 LAB — CBC WITH DIFFERENTIAL/PLATELET
Abs Immature Granulocytes: 0.09 K/uL — ABNORMAL HIGH (ref 0.00–0.07)
Basophils Absolute: 0.1 K/uL (ref 0.0–0.1)
Basophils Relative: 0 %
Eosinophils Absolute: 0 K/uL (ref 0.0–0.5)
Eosinophils Relative: 0 %
HCT: 42.2 % (ref 39.0–52.0)
Hemoglobin: 14.1 g/dL (ref 13.0–17.0)
Immature Granulocytes: 1 %
Lymphocytes Relative: 14 %
Lymphs Abs: 2.3 K/uL (ref 0.7–4.0)
MCH: 27.4 pg (ref 26.0–34.0)
MCHC: 33.4 g/dL (ref 30.0–36.0)
MCV: 81.9 fL (ref 80.0–100.0)
Monocytes Absolute: 1.6 K/uL — ABNORMAL HIGH (ref 0.1–1.0)
Monocytes Relative: 9 %
Neutro Abs: 13.1 K/uL — ABNORMAL HIGH (ref 1.7–7.7)
Neutrophils Relative %: 76 %
Platelets: 326 K/uL (ref 150–400)
RBC: 5.15 MIL/uL (ref 4.22–5.81)
RDW: 13.3 % (ref 11.5–15.5)
WBC: 17.2 K/uL — ABNORMAL HIGH (ref 4.0–10.5)
nRBC: 0 % (ref 0.0–0.2)

## 2023-10-06 LAB — COMPREHENSIVE METABOLIC PANEL WITH GFR
ALT: 19 U/L (ref 0–44)
AST: 25 U/L (ref 15–41)
Albumin: 3.9 g/dL (ref 3.5–5.0)
Alkaline Phosphatase: 52 U/L (ref 38–126)
Anion gap: 17 — ABNORMAL HIGH (ref 5–15)
BUN: 12 mg/dL (ref 6–20)
CO2: 24 mmol/L (ref 22–32)
Calcium: 9 mg/dL (ref 8.9–10.3)
Chloride: 99 mmol/L (ref 98–111)
Creatinine, Ser: 0.81 mg/dL (ref 0.61–1.24)
GFR, Estimated: >60 mL/min (ref >60)
Glucose, Bld: 117 mg/dL — ABNORMAL HIGH (ref 70–99)
Potassium: 3.2 mmol/L — ABNORMAL LOW (ref 3.5–5.1)
Sodium: 140 mmol/L (ref 135–145)
Total Bilirubin: 1.2 mg/dL (ref 0.0–1.2)
Total Protein: 7.3 g/dL (ref 6.5–8.1)

## 2023-10-06 LAB — CBG MONITORING, ED: Glucose-Capillary: 122 mg/dL — ABNORMAL HIGH (ref 70–99)

## 2023-10-06 LAB — LACTIC ACID, PLASMA: Lactic Acid, Venous: 0.8 mmol/L (ref 0.5–1.9)

## 2023-10-06 LAB — VITAMIN D 25 HYDROXY (VIT D DEFICIENCY, FRACTURES): Vit D, 25-Hydroxy: 42.09 ng/mL (ref 30–100)

## 2023-10-06 LAB — CK: Total CK: 869 U/L — ABNORMAL HIGH (ref 49–397)

## 2023-10-06 LAB — HIV ANTIBODY (ROUTINE TESTING W REFLEX): HIV Screen 4th Generation wRfx: NONREACTIVE

## 2023-10-06 LAB — PHOSPHORUS: Phosphorus: 3.9 mg/dL (ref 2.5–4.6)

## 2023-10-06 LAB — TSH: TSH: 1.369 u[IU]/mL (ref 0.350–4.500)

## 2023-10-06 LAB — PROCALCITONIN: Procalcitonin: <0.1 ng/mL

## 2023-10-06 LAB — MAGNESIUM: Magnesium: 1.9 mg/dL (ref 1.7–2.4)

## 2023-10-06 LAB — VITAMIN B12: Vitamin B-12: 377 pg/mL (ref 180–914)

## 2023-10-06 MED ORDER — ENOXAPARIN SODIUM 60 MG/0.6ML IJ SOSY
60.0000 mg | PREFILLED_SYRINGE | Freq: Every evening | INTRAMUSCULAR | Status: DC
  Administered 2023-10-06: 60 mg via SUBCUTANEOUS
  Filled 2023-10-06: qty 0.6

## 2023-10-06 MED ORDER — LABETALOL HCL 200 MG PO TABS
200.0000 mg | ORAL_TABLET | Freq: Three times a day (TID) | ORAL | Status: DC
  Administered 2023-10-06 – 2023-10-11 (×11): 200 mg via ORAL
  Filled 2023-10-06 (×17): qty 1

## 2023-10-06 MED ORDER — SODIUM CHLORIDE 0.9 % IV SOLN: INTRAVENOUS | Status: DC

## 2023-10-06 MED ORDER — CHLORHEXIDINE GLUCONATE CLOTH 2 % EX PADS
6.0000 | MEDICATED_PAD | Freq: Every day | CUTANEOUS | Status: DC
  Administered 2023-10-06: 6 via TOPICAL
  Filled 2023-10-06: qty 6

## 2023-10-06 MED ORDER — HYDRALAZINE HCL 20 MG/ML IJ SOLN
10.0000 mg | INTRAMUSCULAR | Status: AC | PRN
  Administered 2023-10-06: 10 mg via INTRAVENOUS
  Filled 2023-10-06: qty 1

## 2023-10-06 MED ORDER — HYDRALAZINE HCL 20 MG/ML IJ SOLN
5.0000 mg | INTRAMUSCULAR | Status: DC | PRN
  Administered 2023-10-06: 5 mg via INTRAVENOUS
  Filled 2023-10-06: qty 1

## 2023-10-06 MED ORDER — SODIUM CHLORIDE 0.9 % IV BOLUS (SEPSIS)
1000.0000 mL | Freq: Once | INTRAVENOUS | Status: AC
Start: 2023-10-06 — End: 2023-10-06
  Administered 2023-10-06: 1000 mL via INTRAVENOUS

## 2023-10-06 MED ORDER — LABETALOL HCL 5 MG/ML IV SOLN
10.0000 mg | INTRAVENOUS | Status: DC | PRN
  Administered 2023-10-06 – 2023-10-08 (×6): 10 mg via INTRAVENOUS
  Filled 2023-10-06 (×6): qty 4

## 2023-10-06 NOTE — ED Notes (Signed)
 Pt noted to feel hot to the touch. Temperature taken and noted to have a fever of 100.0.  Given tylenol  for same.

## 2023-10-06 NOTE — ED Notes (Signed)
 CCMD called to admit patient into monitor.

## 2023-10-06 NOTE — ED Notes (Signed)
 PT is sleeping but is alert to voice. Will slightly open his eyes and is able to follow commands.

## 2023-10-06 NOTE — TOC CM/SW Note (Signed)
.  Transition of Care [facility]) - Inpatient Brief Assessment   Patient Details  Name: Russell Kennedy MRN: [id number] Date of Birth: Jan 05, 1991  Transition of Care [facility]) CM/SW Contact:    Edsel DELENA Fischer, LCSW Phone Number: 10/06/2023, 5:30 PM   Clinical Narrative:  TOC added PCP/ Clinics and SU to AVS for support  Transition of Care Asessment:

## 2023-10-06 NOTE — ED Notes (Signed)
 Sitter went on break at this time. This RN in to relieve sitter.

## 2023-10-06 NOTE — ED Notes (Signed)
 Lab called for blood draw.

## 2023-10-06 NOTE — ED Notes (Signed)
 Reached out to MD about BP management. Per MD bladder scan pt. Pt retaining 433mL's.

## 2023-10-06 NOTE — ED Notes (Addendum)
 Md made aware of pts BP of 186/128.

## 2023-10-06 NOTE — ED Notes (Signed)
 Md ordered foley. Upon entering rm with foleypt had urinted in brief and all over bed. Bladder scanned again and pt showing 52ml's in bladder. MD made aware.

## 2023-10-06 NOTE — Progress Notes (Signed)
 [facility] Progress Note  Patient: Russell Kennedy    FMW:[id number]  DOA: 10/05/2023     Date of Service: the patient was seen and examined on 10/06/2023  Chief Complaint  Patient presents with   Psychiatric Evaluation   Brief hospital course: Russell Kennedy is a 33 y.o. male with medical history significant for Polysubstance abuse including opioid use disorder as well as alcohol use disorder who arrived to the ED today and in the day under IVC due to aggressive behavior and hallucinations being admitted with a fever of uncertain etiology.  Patient was tachycardic and hypertensive(SBP 170s to 180s) on arrival and was initially treated for acute withdrawal. Toxicology revealed EtOH less than 15 and methadone on UDS.  Salicylate less than 7 CBC and CMP notable for WBC 17,000 and potassium 2.8 Urinalysis with ketones and without infection respiratory panel negative. Patient was started on phenobarbital  for withdrawal in the ED. He was also evaluated by psych and started on olanzapine  He received potassium repletion while in the ED Later in the evening on 9/12, he developed a temperature of 100.6.  He had no muscular rigidity concerning for NMS Chest x-ray was clear Patient had prior trauma imaging for fall that included CT head, C-spine, CT right elbow which were all nonacute. Admission requested for fever workup  Patient is currently somnolent from medications administered in the ED and is unable to contribute to history   Assessment and Plan:  # Acute toxic-metabolic encephalopathy # Acute drug withdrawal with complication # Acute alcohol withdrawal with delirium/hallucinations # Polysubstance abuse # Alcohol use disorder Patient reportedly stopped all substances approximately some days prior and since then became agitated and started hallucinating - continue phenobarb -Continue IVC, continue sitter 9/13 fever resolved     # Fever of undetermined origin Possible SIRS Patient  with isolated fever and otherwise normal vitals Doubt NMS given low-grade temp, one-time dose of olanzapine  in the ED, no muscle stiffness Low suspicion for meningitis/encephalitis-no meningeal signs on exam Could be SIRS related to agitation Given history of substance use will get blood cultures No identifiable source of infection and low suspicion for meningitis We will keep n.p.o. due to somnolence and in case of decision for diagnostic workup with LP Holding off on antibiotics for now--> procalcitonin<0.1, lactic acid 0.8   # Rhabdomyolysis secondary to drug abuse CK8 169  Continue IV fluid for hydration Trend CK level daily Check renal    Hypokalemia Potassium 2.8 on arrival, receiving oral repletion in the ED Pharmacy consult for electrolyte management    # Hypertensive urgency No prior diagnosis of hypertension, elevated BP secondary to withdrawal symptoms and drug abuse.   9/13 started labetalol  200 mg p.o. 3 times daily Use IV labetalol  as needed Monitor BP and titrate medications accordingly     Body mass index is 35.57 kg/m.  Interventions:  Diet: Regular diet DVT Prophylaxis: Lovenox   Advance goals of care discussion: Full code  Family Communication: family was not present at bedside, at the time of interview.  The pt provided permission to discuss medical plan with the family. Opportunity was given to ask question and all questions were answered satisfactorily.   Disposition:  Pt is from home, admitted with withdrawal symptoms, polysubstance abuse, IVC by psych, still has electrolyte imbalance and hypertension, which precludes a safe discharge. Discharge to home versus psych, when stable.   Subjective: No significant events overnight.  Patient was admitted due to withdrawal symptoms, fever of unknown origin. During my encounter patient  was not communicating, he did give me a smile but did not speak a word.  Patient was resting comfortably, without any  acute distress.  Physical Exam: General: NAD, lying comfortably Appear in no distress, affect appropriate Eyes: PERRLA ENT: Oral Mucosa Clear, moist  Neck: no JVD,  Cardiovascular: S1 and S2 Present, no Murmur,  Respiratory: good respiratory effort, Bilateral Air entry equal and Decreased, no Crackles, no wheezes Abdomen: Bowel Sound present, Soft and no tenderness,  Skin: no rashes Extremities: no Pedal edema, no calf tenderness Neurologic: without any new focal findings Gait not checked due to patient safety concerns  Vitals:   10/06/23 1400 10/06/23 1415 10/06/23 1430 10/06/23 1445  BP: (!) 161/130 (!) 157/124 (!) 156/121 (!) 170/134  Pulse: 98 (!) 104 (!) 103 96  Resp: (!) 24 18 17  (!) 25  Temp:      TempSrc:      SpO2: 98% 97% 98% 98%  Weight:      Height:       No intake or output data in the 24 hours ending 10/06/23 1455 Filed Weights   10/05/23 0105  Weight: 136.1 kg    Data Reviewed: I have personally reviewed and interpreted daily labs, tele strips, imagings as discussed above. I reviewed all nursing notes, pharmacy notes, vitals, pertinent old records I have discussed plan of care as described above with RN and patient/family.  CBC: Recent Labs  Lab 10/02/23 1523 10/02/23 1603 10/05/23 0356 10/06/23 0049  WBC  --  20.1* 17.3* 17.2*  NEUTROABS  --  17.8*  --  13.1*  HGB 15.0  15.6 14.8 14.0 14.1  HCT 44.0  46.0 44.9 41.5 42.2  MCV  --  81.5 81.9 81.9  PLT  --  426* 365 326   Basic Metabolic Panel: Recent Labs  Lab 10/02/23 1603 10/05/23 0331 10/05/23 0356 10/06/23 0049 10/06/23 0600 10/06/23 1220  NA 137  --  140 140 140 141  K 3.5  --  2.8* 3.2* 3.2* 3.3*  CL 98  --  98 99 100 101  CO2 22  --  28 24 23 25   GLUCOSE 128*  --  127* 117* 115* 124*  BUN 10  --  12 12 12 12   CREATININE 1.08  --  0.95 0.81 0.71 0.77  CALCIUM 9.9  --  9.3 9.0 9.1 9.3  MG 2.1 2.0  --  1.9  --   --   PHOS  --   --   --  3.9  --   --     Studies: DG Chest  Portable 1 View Result Date: 10/05/2023 CLINICAL DATA:  Fever EXAM: PORTABLE CHEST 1 VIEW COMPARISON:  Chest x-ray 10/05/2023 FINDINGS: The heart size and mediastinal contours are within normal limits. Both lungs are clear. The visualized skeletal structures are unremarkable. IMPRESSION: No active disease. Electronically Signed   By: Greig Pique M.D.   On: 10/05/2023 21:03    Scheduled Meds:  labetalol   200 mg Oral TID   OLANZapine  zydis  5 mg Oral BID   phenobarbital   97.2 mg Oral Q8H   Followed by   NOREEN ON 10/08/2023] phenobarbital   64.8 mg Oral Q8H   Followed by   [START ON 10/10/2023] phenobarbital   32.4 mg Oral Q8H   potassium chloride   40 mEq Oral BID   Continuous Infusions:  sodium chloride  100 mL/hr at 10/06/23 1246   PRN Meds: acetaminophen  **OR** acetaminophen , labetalol , ondansetron  **OR** ondansetron  (ZOFRAN ) IV  Time spent: 24  minutes  Author: ELVAN SOR. MD [facility] 10/06/2023 2:55 PM  To reach On-call, see care teams to locate the attending and reach out to them via [email or web address]. If 7PM-7AM, please contact night-coverage If you still have difficulty reaching the attending provider, please page the [facility] (Director on Call) for [facility] on amion for assistance.

## 2023-10-06 NOTE — ED Notes (Signed)
 Pt sitting in bed with eyes opened. Attempted to ask pt questions. Pt responded by smiling, No verbal answer.

## 2023-10-06 NOTE — ED Notes (Signed)
 Sitter returned to bedside.

## 2023-10-06 NOTE — ED Notes (Signed)
 Advised nurse that patient has ready bed

## 2023-10-06 NOTE — ED Notes (Signed)
 This RN noted redness to patient's R arm. Patient's R arm, L arm, trunk, and face noted to be excessively warm compared to his lower extremities. This RN notified MD [place]. See new orders.

## 2023-10-06 NOTE — ED Notes (Signed)
 Pt changed at this time. Rectal temp taken at 101.2. Tylenol  suppository given.

## 2023-10-06 NOTE — ED Notes (Signed)
 MD made aware of SBP increase from 186 to 194. Current BP at 194/126. No response from MD at this time.

## 2023-10-07 DIAGNOSIS — G9341 Metabolic encephalopathy: Secondary | ICD-10-CM | POA: Diagnosis not present

## 2023-10-07 LAB — BASIC METABOLIC PANEL WITH GFR
Anion gap: 10 (ref 5–15)
BUN: 14 mg/dL (ref 6–20)
CO2: 27 mmol/L (ref 22–32)
Calcium: 8.9 mg/dL (ref 8.9–10.3)
Chloride: 101 mmol/L (ref 98–111)
Creatinine, Ser: 0.78 mg/dL (ref 0.61–1.24)
GFR, Estimated: >60 mL/min (ref >60)
Glucose, Bld: 119 mg/dL — ABNORMAL HIGH (ref 70–99)
Potassium: 4 mmol/L (ref 3.5–5.1)
Sodium: 138 mmol/L (ref 135–145)

## 2023-10-07 LAB — CBC
HCT: 41.7 % (ref 39.0–52.0)
Hemoglobin: 13.8 g/dL (ref 13.0–17.0)
MCH: 27.2 pg (ref 26.0–34.0)
MCHC: 33.1 g/dL (ref 30.0–36.0)
MCV: 82.1 fL (ref 80.0–100.0)
Platelets: 274 K/uL (ref 150–400)
RBC: 5.08 MIL/uL (ref 4.22–5.81)
RDW: 13.3 % (ref 11.5–15.5)
WBC: 14.2 K/uL — ABNORMAL HIGH (ref 4.0–10.5)
nRBC: 0 % (ref 0.0–0.2)

## 2023-10-07 LAB — MAGNESIUM: Magnesium: 2.1 mg/dL (ref 1.7–2.4)

## 2023-10-07 LAB — PHOSPHORUS: Phosphorus: 3.3 mg/dL (ref 2.5–4.6)

## 2023-10-07 LAB — GLUCOSE, CAPILLARY: Glucose-Capillary: 97 mg/dL (ref 70–99)

## 2023-10-07 LAB — CK: Total CK: 851 U/L — ABNORMAL HIGH (ref 49–397)

## 2023-10-07 MED ORDER — PHENOBARBITAL SODIUM 65 MG/ML IJ SOLN
32.4000 mg | Freq: Three times a day (TID) | INTRAMUSCULAR | Status: DC
  Administered 2023-10-11 – 2023-10-12 (×2): 32.4 mg via INTRAVENOUS
  Filled 2023-10-07 (×2): qty 1

## 2023-10-07 MED ORDER — SODIUM CHLORIDE 0.9 % IV SOLN: INTRAVENOUS | Status: DC

## 2023-10-07 MED ORDER — PHENOBARBITAL SODIUM 130 MG/ML IJ SOLN
97.2000 mg | Freq: Three times a day (TID) | INTRAMUSCULAR | Status: AC
Start: 2023-10-07 — End: 2023-10-09
  Administered 2023-10-07 – 2023-10-09 (×6): 97.2 mg via INTRAVENOUS
  Filled 2023-10-07 (×6): qty 1

## 2023-10-07 MED ORDER — ACETAMINOPHEN 10 MG/ML IV SOLN
1000.0000 mg | Freq: Four times a day (QID) | INTRAVENOUS | Status: AC | PRN
  Administered 2023-10-07: 1000 mg via INTRAVENOUS
  Filled 2023-10-07: qty 100

## 2023-10-07 MED ORDER — PHENOBARBITAL SODIUM 65 MG/ML IJ SOLN
64.8000 mg | Freq: Three times a day (TID) | INTRAMUSCULAR | Status: AC
  Administered 2023-10-09 – 2023-10-11 (×6): 64.8 mg via INTRAVENOUS
  Filled 2023-10-07 (×6): qty 1

## 2023-10-07 MED ORDER — OLANZAPINE 5 MG PO TABS
5.0000 mg | ORAL_TABLET | Freq: Every day | ORAL | Status: DC
  Administered 2023-10-08 – 2023-10-14 (×7): 5 mg via ORAL
  Filled 2023-10-07 (×7): qty 1

## 2023-10-07 MED ORDER — ENOXAPARIN SODIUM 80 MG/0.8ML IJ SOSY
65.0000 mg | PREFILLED_SYRINGE | Freq: Every evening | INTRAMUSCULAR | Status: DC
Start: 2023-10-07 — End: 2023-10-15
  Administered 2023-10-07 – 2023-10-14 (×8): 65 mg via SUBCUTANEOUS
  Filled 2023-10-07: qty 0.8
  Filled 2023-10-07: qty 0.65
  Filled 2023-10-07: qty 0.8
  Filled 2023-10-07: qty 0.65
  Filled 2023-10-07: qty 0.8
  Filled 2023-10-07: qty 0.65
  Filled 2023-10-07 (×3): qty 0.8

## 2023-10-07 NOTE — Progress Notes (Signed)
 Patient ID: Russell Kennedy, male   DOB: 1990/02/24, 33 y.o.   MRN: [id number] [facility] Psychiatric Consult Follow-up  Patient Name: .Russell Kennedy  MRN: [id number]  DOB: 12-15-1990  Consult Order details:  Orders (From admission, onward)     Start     Ordered   10/05/23 0345  IP CONSULT TO PSYCHIATRY       Ordering Provider: Neomi Josette SAILOR, DO  Provider:  (Not yet assigned)  Question:  Reason for consult:  Answer:  Medication management   10/05/23 0344             Mode of Visit: In person    Psychiatry Consult Evaluation  Service Date: October 07, 2023 LOS:  LOS: 1 day  Chief Complaint Polysubstance Abuse  Primary Psychiatric Diagnoses  Polysubstance Abuse   Assessment  Russell Kennedy is a 33 y.o. male admitted: Medically  Initial note:  Consult Note:  Patient is assessed in the [facility] ED and is currently under IVC for substance abuse and becoming physically aggressive with his mother. Patient is unable to engage in a meaningful interview at this time. He is alert to self only and does not respond to questions appropriately. He is admits that he abuses fentanyl  and alprazolam however is not able to respond to how long or his last use. He is staring at provider and counseling in a threatening way and is able to be redirected from this behavior. Review of chart, shows no recent encounters. Mom attempted to get substance abuse treatment and has been accepted to [facility]. She reports that he has not slept in at least 5 days and began to have delusional thoughts.   10/07/2023: Patient is unable to be reassessed at this time. He continues to be somnolent and does not wake with calling his name or with touch on his arm. Sitter at bedside and explains that he has been sleeping since her shift started at 0700. He has minimal engagement with staff or providers. Will hold night dose and tomorrow morning dose of olanzapine  and re-evaluate his mental status.    Diagnoses:   Active Hospital problems: Principal Problem:   Acute toxic-metabolic encephalopathy Active Problems:   Acute drug withdrawal syndrome with complication (HCC)   Polysubstance abuse (HCC)   Fever of undetermined origin   Alcohol use disorder   Elevated blood pressure reading without diagnosis of hypertension   Hypokalemia    Plan   ## Psychiatric Medication Recommendations:  Hold tonight's dose and tomorrow mornings dose of olanzapine  to evaluate mental status.   ## Medical Decision Making Capacity: Not specifically addressed in this encounter  ## Further Work-up:   -- most recent EKG on 10/05/2023 had QtC of 484 -- Pertinent labwork reviewed earlier this admission includes: Total CK 851, WBC 14.2   ## Disposition:-- Will continue to monitor to determine need for inpatient psychiatric care.   ## Behavioral / Environmental: -Utilize compassion and acknowledge the patient's experiences while setting clear and realistic expectations for care.    ## Safety and Observation Level:  - Based on my clinical evaluation, I estimate the patient to be at LOW risk of self harm in the current setting. - At this time, we recommend  1:1 Observation. This decision is based on my review of the chart including patient's history and current presentation, interview of the patient, mental status examination, and consideration of suicide risk including evaluating suicidal ideation, plan, intent, suicidal or self-harm behaviors, risk factors, and protective factors. This judgment  is based on our ability to directly address suicide risk, implement suicide prevention strategies, and develop a safety plan while the patient is in the clinical setting. Please contact our team if there is a concern that risk level has changed.  CSSR Risk Category:C-SSRS RISK CATEGORY: No Risk  Suicide Risk Assessment: Patient has following modifiable risk factors for suicide: recklessness, which we are addressing by sitter at  bedside. Patient has following non-modifiable or demographic risk factors for suicide: male gender Patient has the following protective factors against suicide: Supportive family  Thank you for this consult request. Recommendations have been communicated to the primary team.  We will continue to monitor at this time.   Azyah Flett B Devion Chriscoe, NP       History of Present Illness  Relevant Aspects of [facility]   Patient Report:  Patient is unable to be reassessed at this time. He continues to be somnolent and does not wake with calling his name or with touch on his arm. Sitter at bedside and explains that he has been sleeping since her shift started at 0700. He has minimal engagement with staff or providers. Will hold night dose and tomorrow morning dose of olanzapine  and re-evaluate his mental status.   Psych ROS:  Depression: could not assess Anxiety:   could not assess Mania (lifetime and current):  could not assess Psychosis: (lifetime and current):  could not assess  Collateral information:  N/A    Psychiatric and Social History  Psychiatric History:  Information collected from patient's chart  Prev Dx/Sx: polysubstance abuse Current Psych Provider: methadone clinic [facility] Home Meds (current): methadone Previous Med Trials: methadone Therapy: denies  Prior Psych Hospitalization: denies  Prior Self Harm: denies  Prior Violence: aggression towards mother  Family Psych History: denies  Family Hx suicide: denies   Social History:   Educational Hx: dropped out of high school Occupational Hx: 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  Living Situation: with mother--otherwise homeless Spiritual Hx: unknown Access to weapons/lethal means: denies   Substance History Alcohol: unknown  Type of alcohol unknown  Last Drink unknown  Number of drinks per day unknown  History of alcohol withdrawal seizures unknown  History of DT's unknown  Tobacco:  unknown  Illicit drugs: fentanyl  and alprazolam Prescription drug abuse: alprazolam  Rehab hx: denies  Exam Findings  Physical Exam: I have reviewed and agree with recent exam Vital Signs:  Temp:  [97.9 F (36.6 C)-101.2 F (38.4 C)] 98.8 F (37.1 C) (09/14 1141) Pulse Rate:  [81-104] 88 (09/14 1141) Resp:  [10-27] 22 (09/14 1141) BP: (120-193)/(90-138) 178/111 (09/14 1141) SpO2:  [95 %-100 %] 99 % (09/14 1141) Blood pressure (!) 178/111, pulse 88, temperature 98.8 F (37.1 C), temperature source Oral, resp. rate (!) 22, height 6' 5 (1.956 m), weight 136.1 kg, SpO2 99%. Body mass index is 35.57 kg/m.    Mental Status Exam: General Appearance: Fairly Groomed  Orientation:  Could not assess  Memory:  Could not assess  Concentration:  Could not assess  Recall:  Could not assess  Attention  Could not assess  Eye Contact:  Could not assess  Speech:  Could not assess  Language:  Could not assess  Volume:  Could not assess  Mood: Could not assess  Affect:  Could not assess  Thought Process:  Could not assess  Thought Content:  Could not assess  Suicidal Thoughts:  Could not assess  Homicidal Thoughts:  Could not assess  Judgement:  Could not  assess  Insight:  Could not assess  Psychomotor Activity:  Could not assess  Akathisia:  Could not assess  Fund of Knowledge:  Could not assessCould not assess      Assets:  Could not assess  Cognition:  Impaired,  Severe  ADL's:  Impaired  AIMS (if indicated):        Other History   These have been pulled in through the EMR, reviewed, and updated if appropriate.  Family History:  The patient's family history is not on file.  Medical History: Past Medical History:  Diagnosis Date   Polysubstance abuse (HCC)     Surgical History: Past Surgical History:  Procedure Laterality Date   ROTATOR CUFF REPAIR       Medications:   Current Facility-Administered Medications:    0.9 %  sodium chloride  infusion, , Intravenous,  Continuous, Von Bellis, MD, Last Rate: 100 mL/hr at 10/07/23 0956, New Bag at 10/07/23 0956   acetaminophen  (TYLENOL ) tablet 650 mg, 650 mg, Oral, Q6H PRN, 650 mg at 10/06/23 0620 **OR** acetaminophen  (TYLENOL ) suppository 650 mg, 650 mg, Rectal, Q6H PRN, Cleatus Delayne GAILS, MD, 650 mg at 10/06/23 1749   Chlorhexidine  Gluconate Cloth 2 % PADS 6 each, 6 each, Topical, Daily, Von Bellis, MD, 6 each at 10/06/23 1856   enoxaparin  (LOVENOX ) injection 65 mg, 65 mg, Subcutaneous, QPM, Hallaji, Sheema M, RPH   labetalol  (NORMODYNE ) injection 10 mg, 10 mg, Intravenous, Q4H PRN, Von Bellis, MD, 10 mg at 10/07/23 9093   labetalol  (NORMODYNE ) tablet 200 mg, 200 mg, Oral, TID, Von Bellis, MD, 200 mg at 10/06/23 2146   OLANZapine  zydis (ZYPREXA ) disintegrating tablet 5 mg, 5 mg, Oral, BID, Enola, Teana Lindahl B, NP, 5 mg at 10/06/23 2030   ondansetron  (ZOFRAN ) tablet 4 mg, 4 mg, Oral, Q6H PRN **OR** ondansetron  (ZOFRAN ) injection 4 mg, 4 mg, Intravenous, Q6H PRN, Cleatus Delayne GAILS, MD   PHENobarbital  (LUMINAL) tablet 97.2 mg, 97.2 mg, Oral, Q8H, 97.2 mg at 10/07/23 0407 **FOLLOWED BY** [START ON 10/08/2023] PHENobarbital  (LUMINAL) tablet 64.8 mg, 64.8 mg, Oral, Q8H **FOLLOWED BY** [START ON 10/10/2023] PHENobarbital  (LUMINAL) tablet 32.4 mg, 32.4 mg, Oral, Q8H, Cleatus Delayne GAILS, MD  Allergies: No Known Allergies  Daine KATHEE Enola, NP

## 2023-10-07 NOTE — Progress Notes (Signed)
 [facility] Progress Note  Patient: Russell Kennedy    FMW:[id number]  DOA: 10/05/2023     Date of Service: the patient was seen and examined on 10/07/2023  Chief Complaint  Patient presents with   Psychiatric Evaluation   Brief hospital course: Russell Kennedy is a 33 y.o. male with medical history significant for Polysubstance abuse including opioid use disorder as well as alcohol use disorder who arrived to the ED today and in the day under IVC due to aggressive behavior and hallucinations being admitted with a fever of uncertain etiology.  Patient was tachycardic and hypertensive(SBP 170s to 180s) on arrival and was initially treated for acute withdrawal. Toxicology revealed EtOH less than 15 and methadone on UDS.  Salicylate less than 7 CBC and CMP notable for WBC 17,000 and potassium 2.8 Urinalysis with ketones and without infection respiratory panel negative. Patient was started on phenobarbital  for withdrawal in the ED. He was also evaluated by psych and started on olanzapine  He received potassium repletion while in the ED Later in the evening on 9/12, he developed a temperature of 100.6.  He had no muscular rigidity concerning for NMS Chest x-ray was clear Patient had prior trauma imaging for fall that included CT head, C-spine, CT right elbow which were all nonacute. Admission requested for fever workup  Patient is currently somnolent from medications administered in the ED and is unable to contribute to history   Assessment and Plan:  # Acute toxic-metabolic encephalopathy # Acute drug withdrawal with complication # Acute alcohol withdrawal with delirium/hallucinations # Polysubstance abuse # Alcohol use disorder Patient reportedly stopped all substances approximately some days prior and since then became agitated and started hallucinating - continue phenobarb -Continue IVC, continue sitter 9/13 fever resolved     # Fever of undetermined origin Possible SIRS Patient  with isolated fever and otherwise normal vitals Doubt NMS given low-grade temp, one-time dose of olanzapine  in the ED, no muscle stiffness Low suspicion for meningitis/encephalitis-no meningeal signs on exam Could be SIRS related to agitation Given history of substance use will get blood cultures No identifiable source of infection and low suspicion for meningitis We will keep n.p.o. due to somnolence and in case of decision for diagnostic workup with LP Holding off on antibiotics for now--> procalcitonin<0.1, lactic acid 0.8   # Rhabdomyolysis secondary to drug abuse CK 869>>851  Continue IV fluid for hydration Trend CK level daily Check renal    Hypokalemia: Resolved Potassium 2.8 on arrival, receiving oral repletion in the ED Pharmacy consult for electrolyte management    # Hypertensive urgency No prior diagnosis of hypertension, elevated BP secondary to withdrawal symptoms and drug abuse.   9/13 started labetalol  200 mg p.o. 3 times daily Use IV labetalol  as needed Monitor BP and titrate medications accordingly     Body mass index is 35.57 kg/m.  Interventions:  Diet: Regular diet DVT Prophylaxis: Lovenox   Advance goals of care discussion: Full code  Family Communication: family was not present at bedside, at the time of interview.  The pt provided permission to discuss medical plan with the family. Opportunity was given to ask question and all questions were answered satisfactorily.   Disposition:  Pt is from home, admitted with withdrawal symptoms, polysubstance abuse, IVC by psych, still has electrolyte imbalance and hypertension, which precludes a safe discharge. Discharge to home versus psych, when stable.   Subjective: No significant events overnight.  Patient was lying comfortably, still patient is not communicating with anyone and patient is  not eating or drinking. RN was advised to feed him and offer him food water he wants to eat.   Physical  Exam: General: NAD, lying comfortably Appear in no distress, affect appropriate Eyes: PERRLA ENT: Oral Mucosa Clear, moist  Neck: no JVD,  Cardiovascular: S1 and S2 Present, no Murmur,  Respiratory: good respiratory effort, Bilateral Air entry equal and Decreased, no Crackles, no wheezes Abdomen: Bowel Sound present, Soft and no tenderness,  Skin: no rashes Extremities: no Pedal edema, no calf tenderness Neurologic: without any new focal findings Gait not checked due to patient safety concerns  Vitals:   10/07/23 0426 10/07/23 0855 10/07/23 0952 10/07/23 1141  BP:  (!) 178/105 (!) 182/110 (!) 178/111  Pulse: 87 81  88  Resp:  20  (!) 22  Temp:  99 F (37.2 C)  98.8 F (37.1 C)  TempSrc:  Axillary  Oral  SpO2: 97%   99%  Weight:      Height:        Intake/Output Summary (Last 24 hours) at 10/07/2023 1404 Last data filed at 10/07/2023 1300 Gross per 24 hour  Intake 2001.1 ml  Output --  Net 2001.1 ml   Filed Weights   10/05/23 0105  Weight: 136.1 kg    Data Reviewed: I have personally reviewed and interpreted daily labs, tele strips, imagings as discussed above. I reviewed all nursing notes, pharmacy notes, vitals, pertinent old records I have discussed plan of care as described above with RN and patient/family.  CBC: Recent Labs  Lab 10/02/23 1523 10/02/23 1603 10/05/23 0356 10/06/23 0049 10/07/23 0618  WBC  --  20.1* 17.3* 17.2* 14.2*  NEUTROABS  --  17.8*  --  13.1*  --   HGB 15.0  15.6 14.8 14.0 14.1 13.8  HCT 44.0  46.0 44.9 41.5 42.2 41.7  MCV  --  81.5 81.9 81.9 82.1  PLT  --  426* 365 326 274   Basic Metabolic Panel: Recent Labs  Lab 10/02/23 1603 10/05/23 0331 10/05/23 0356 10/06/23 0049 10/06/23 0600 10/06/23 1220 10/07/23 0618  NA 137  --  140 140 140 141 138  K 3.5  --  2.8* 3.2* 3.2* 3.3* 4.0  CL 98  --  98 99 100 101 101  CO2 22  --  28 24 23 25 27   GLUCOSE 128*  --  127* 117* 115* 124* 119*  BUN 10  --  12 12 12 12 14    CREATININE 1.08  --  0.95 0.81 0.71 0.77 0.78  CALCIUM 9.9  --  9.3 9.0 9.1 9.3 8.9  MG 2.1 2.0  --  1.9  --   --  2.1  PHOS  --   --   --  3.9  --   --  3.3    Studies: No results found.   Scheduled Meds:  Chlorhexidine  Gluconate Cloth  6 each Topical Daily   enoxaparin  (LOVENOX ) injection  65 mg Subcutaneous QPM   labetalol   200 mg Oral TID   [START ON 10/08/2023] OLANZapine   5 mg Oral QHS   phenobarbital   97.2 mg Oral Q8H   Followed by   [START ON 10/08/2023] phenobarbital   64.8 mg Oral Q8H   Followed by   [START ON 10/10/2023] phenobarbital   32.4 mg Oral Q8H   Continuous Infusions:  sodium chloride  20 mL/hr at 10/07/23 1300   PRN Meds: acetaminophen  **OR** acetaminophen , labetalol , ondansetron  **OR** ondansetron  (ZOFRAN ) IV  Time spent: 40 minutes  Author: ELVAN SOR. MD  [facility] 10/07/2023 2:04 PM  To reach On-call, see care teams to locate the attending and reach out to them via [email or web address]. If 7PM-7AM, please contact night-coverage If you still have difficulty reaching the attending provider, please page the [facility] (Director on Call) for [facility] on amion for assistance.

## 2023-10-08 DIAGNOSIS — G9341 Metabolic encephalopathy: Secondary | ICD-10-CM | POA: Diagnosis not present

## 2023-10-08 LAB — MAGNESIUM: Magnesium: 2.1 mg/dL (ref 1.7–2.4)

## 2023-10-08 LAB — CBC
HCT: 38.9 % — ABNORMAL LOW (ref 39.0–52.0)
Hemoglobin: 12.9 g/dL — ABNORMAL LOW (ref 13.0–17.0)
MCH: 27.2 pg (ref 26.0–34.0)
MCHC: 33.2 g/dL (ref 30.0–36.0)
MCV: 81.9 fL (ref 80.0–100.0)
Platelets: 293 K/uL (ref 150–400)
RBC: 4.75 MIL/uL (ref 4.22–5.81)
RDW: 13.2 % (ref 11.5–15.5)
WBC: 13.6 K/uL — ABNORMAL HIGH (ref 4.0–10.5)
nRBC: 0 % (ref 0.0–0.2)

## 2023-10-08 LAB — BASIC METABOLIC PANEL WITH GFR
Anion gap: 9 (ref 5–15)
BUN: 15 mg/dL (ref 6–20)
CO2: 24 mmol/L (ref 22–32)
Calcium: 8.5 mg/dL — ABNORMAL LOW (ref 8.9–10.3)
Chloride: 104 mmol/L (ref 98–111)
Creatinine, Ser: 0.58 mg/dL — ABNORMAL LOW (ref 0.61–1.24)
GFR, Estimated: >60 mL/min (ref >60)
Glucose, Bld: 104 mg/dL — ABNORMAL HIGH (ref 70–99)
Potassium: 3.5 mmol/L (ref 3.5–5.1)
Sodium: 137 mmol/L (ref 135–145)

## 2023-10-08 LAB — PHOSPHORUS: Phosphorus: 3.1 mg/dL (ref 2.5–4.6)

## 2023-10-08 LAB — CK: Total CK: 697 U/L — ABNORMAL HIGH (ref 49–397)

## 2023-10-08 MED ORDER — HYDRALAZINE HCL 20 MG/ML IJ SOLN
5.0000 mg | INTRAMUSCULAR | Status: DC | PRN
  Administered 2023-10-08 – 2023-10-09 (×3): 5 mg via INTRAVENOUS
  Filled 2023-10-08 (×3): qty 1

## 2023-10-08 MED ORDER — SODIUM CHLORIDE 0.9 % IV SOLN: INTRAVENOUS | Status: AC

## 2023-10-08 MED ORDER — ZIPRASIDONE MESYLATE 20 MG IM SOLR
10.0000 mg | Freq: Once | INTRAMUSCULAR | Status: AC
  Administered 2023-10-08: 10 mg via INTRAMUSCULAR
  Filled 2023-10-08: qty 20

## 2023-10-08 MED ORDER — CLONIDINE HCL 0.2 MG/24HR TD PTWK
0.2000 mg | MEDICATED_PATCH | TRANSDERMAL | Status: DC
  Administered 2023-10-08 – 2023-10-15 (×2): 0.2 mg via TRANSDERMAL
  Filled 2023-10-08 (×2): qty 1

## 2023-10-08 MED ORDER — LABETALOL HCL 5 MG/ML IV SOLN
10.0000 mg | INTRAVENOUS | Status: DC | PRN
  Administered 2023-10-08 – 2023-10-11 (×3): 10 mg via INTRAVENOUS
  Filled 2023-10-08 (×4): qty 4

## 2023-10-08 MED ORDER — HALOPERIDOL LACTATE 5 MG/ML IJ SOLN
2.0000 mg | Freq: Four times a day (QID) | INTRAMUSCULAR | Status: DC | PRN
  Administered 2023-10-08 – 2023-10-11 (×5): 2 mg via INTRAVENOUS
  Filled 2023-10-08 (×5): qty 1

## 2023-10-08 NOTE — Plan of Care (Signed)
 Patient still very confused and can be aggressive at times.  Remains in soft writs restraints.  Having urinary retention and plan to start flomax as patient more alert to swallow.

## 2023-10-08 NOTE — Progress Notes (Signed)
 Pt able to squeeze left hand with strong grip and right with weak/moderate at reassessment. This is the only command the pt has seemed to follow so far. Pt able to turn self in bed and lay on side as desired. Tylenol  IV given x 1 for low grade fever. Phenobarb given IV due to pt no longer taking anything by mouth. Report given that pt had not had any PO intake of fluids or food all day. CBG checked. BP continued to stay elevated, despite IV labetalol .

## 2023-10-08 NOTE — Progress Notes (Signed)
 [facility] Progress Note  Patient: Russell Kennedy    FMW:[id number]  DOA: 10/05/2023     Date of Service: the patient was seen and examined on 10/08/2023  Chief Complaint  Patient presents with   Psychiatric Evaluation   Brief hospital course: Kolbey Teichert is a 33 y.o. male with medical history significant for Polysubstance abuse including opioid use disorder as well as alcohol use disorder who arrived to the ED today and in the day under IVC due to aggressive behavior and hallucinations being admitted with a fever of uncertain etiology.  Patient was tachycardic and hypertensive(SBP 170s to 180s) on arrival and was initially treated for acute withdrawal. Toxicology revealed EtOH less than 15 and methadone on UDS.  Salicylate less than 7 CBC and CMP notable for WBC 17,000 and potassium 2.8 Urinalysis with ketones and without infection respiratory panel negative. Patient was started on phenobarbital  for withdrawal in the ED. He was also evaluated by psych and started on olanzapine  He received potassium repletion while in the ED Later in the evening on 9/12, he developed a temperature of 100.6.  He had no muscular rigidity concerning for NMS Chest x-ray was clear Patient had prior trauma imaging for fall that included CT head, C-spine, CT right elbow which were all nonacute. Admission requested for fever workup  Patient is currently somnolent from medications administered in the ED and is unable to contribute to history   Assessment and Plan:  # Acute toxic-metabolic encephalopathy # Acute drug withdrawal with complication # Acute alcohol withdrawal with delirium/hallucinations # Polysubstance abuse # Alcohol use disorder Patient reportedly stopped all substances approximately some days prior and since then became agitated and started hallucinating - continue phenobarb -Continue IVC, continue sitter 9/13 fever resolved   9/15 Haldol  IV x 1 dose and Geodon  10 mg IM x 1 dose given  due to acute agitation    # Fever of undetermined origin Possible SIRS Patient with isolated fever and otherwise normal vitals Doubt NMS given low-grade temp, one-time dose of olanzapine  in the ED, no muscle stiffness Low suspicion for meningitis/encephalitis-no meningeal signs on exam Could be SIRS related to agitation Given history of substance use will get blood cultures No identifiable source of infection and low suspicion for meningitis We will keep n.p.o. due to somnolence and in case of decision for diagnostic workup with LP Holding off on antibiotics for now--> procalcitonin<0.1, lactic acid 0.8   # Rhabdomyolysis secondary to drug abuse CK 869>>851>> 697 Continue IV fluid for hydration Trend CK level daily Check renal    Hypokalemia: Resolved Potassium 2.8 on arrival, receiving oral repletion in the ED Pharmacy consult for electrolyte management    # Hypertensive urgency No prior diagnosis of hypertension, elevated BP secondary to withdrawal symptoms and drug abuse.   9/13 started labetalol  200 mg p.o. 3 times daily Use IV labetalol  as needed Monitor BP and titrate medications accordingly     Body mass index is 35.57 kg/m.  Interventions:  Diet: Regular diet DVT Prophylaxis: Lovenox   Advance goals of care discussion: Full code  Family Communication: family was not present at bedside, at the time of interview.  The pt provided permission to discuss medical plan with the family. Opportunity was given to ask question and all questions were answered satisfactorily.   Disposition:  Pt is from home, admitted with withdrawal symptoms, polysubstance abuse, IVC by psych, still has electrolyte imbalance and hypertension, which precludes a safe discharge. Discharge to home versus psych, when stable.  Subjective: No significant events overnight.  Patient was lying comfortably, still patient is not communicating with anyone and patient is not eating or drinking. RN  was advised to feed him and offer him food water he wants to eat.   Physical Exam: General: NAD, lying comfortably, sleepy after medication Eyes: Closed ENT: Mouth was close Neck: no JVD,  Cardiovascular: S1 and S2 Present, no Murmur,  Respiratory: good respiratory effort, Bilateral Air entry equal and Decreased, no Crackles, no wheezes Abdomen: Bowel Sound present, Soft and no tenderness,  Skin: no rashes Extremities: no Pedal edema, no calf tenderness Neurologic: without any new focal findings Gait not checked due to patient safety concerns  Vitals:   10/08/23 0535 10/08/23 0755 10/08/23 1148 10/08/23 1347  BP: (!) 174/111 (!) 185/122 (!) 193/118 (!) 183/106  Pulse:  95    Resp: 17 20 (!) 22   Temp:  98.8 F (37.1 C) 98.5 F (36.9 C)   TempSrc:   Axillary   SpO2:  100% 97%   Weight:      Height:        Intake/Output Summary (Last 24 hours) at 10/08/2023 1356 Last data filed at 10/08/2023 1000 Gross per 24 hour  Intake 2155.81 ml  Output 400 ml  Net 1755.81 ml   Filed Weights   10/05/23 0105  Weight: 136.1 kg    Data Reviewed: I have personally reviewed and interpreted daily labs, tele strips, imagings as discussed above. I reviewed all nursing notes, pharmacy notes, vitals, pertinent old records I have discussed plan of care as described above with RN and patient/family.  CBC: Recent Labs  Lab 10/02/23 1603 10/05/23 0356 10/06/23 0049 10/07/23 0618 10/08/23 0323  WBC 20.1* 17.3* 17.2* 14.2* 13.6*  NEUTROABS 17.8*  --  13.1*  --   --   HGB 14.8 14.0 14.1 13.8 12.9*  HCT 44.9 41.5 42.2 41.7 38.9*  MCV 81.5 81.9 81.9 82.1 81.9  PLT 426* 365 326 274 293   Basic Metabolic Panel: Recent Labs  Lab 10/02/23 1603 10/05/23 0331 10/05/23 0356 10/06/23 0049 10/06/23 0600 10/06/23 1220 10/07/23 0618 10/08/23 0323  NA 137  --    < > 140 140 141 138 137  K 3.5  --    < > 3.2* 3.2* 3.3* 4.0 3.5  CL 98  --    < > 99 100 101 101 104  CO2 22  --    < > 24 23  25 27 24   GLUCOSE 128*  --    < > 117* 115* 124* 119* 104*  BUN 10  --    < > 12 12 12 14 15   CREATININE 1.08  --    < > 0.81 0.71 0.77 0.78 0.58*  CALCIUM 9.9  --    < > 9.0 9.1 9.3 8.9 8.5*  MG 2.1 2.0  --  1.9  --   --  2.1 2.1  PHOS  --   --   --  3.9  --   --  3.3 3.1   < > = values in this interval not displayed.    Studies: No results found.   Scheduled Meds:  cloNIDine   0.2 mg Transdermal Weekly   enoxaparin  (LOVENOX ) injection  65 mg Subcutaneous QPM   labetalol   200 mg Oral TID   OLANZapine   5 mg Oral QHS   PHENObarbital   97.2 mg Intravenous Q8H   Followed by   [START ON 10/09/2023] PHENObarbital   64.8 mg Intravenous Q8H  Followed by   NOREEN ON 10/11/2023] PHENObarbital   32.4 mg Intravenous Q8H   phenobarbital   64.8 mg Oral Q8H   Followed by   NOREEN ON 10/10/2023] phenobarbital   32.4 mg Oral Q8H   Continuous Infusions:  sodium chloride  100 mL/hr at 10/08/23 1000   PRN Meds: acetaminophen  **OR** acetaminophen , haloperidol  lactate, hydrALAZINE , labetalol , ondansetron  **OR** ondansetron  (ZOFRAN ) IV  Time spent: 55 minutes  Author: ELVAN SOR. MD [facility] 10/08/2023 1:56 PM  To reach On-call, see care teams to locate the attending and reach out to them via [email or web address]. If 7PM-7AM, please contact night-coverage If you still have difficulty reaching the attending provider, please page the [facility] (Director on Call) for [facility] on amion for assistance.
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
## 2023-10-08 NOTE — Plan of Care (Signed)
 See notes for events during shift. Pt able to sleep on/off throughout shift. Pt with tremors/shakes throughout shift. Moves all extremities with purposeful movement such as placing arm above head, crossing legs, lifting legs, turning from side to side. Phenobarbital  taper switched to IV due to patient no longer taking PO meds. Pt had large bowel movement and was incontinent.    Problem: Fluid Volume: Goal: Hemodynamic stability will improve Outcome: Progressing   Problem: Clinical Measurements: Goal: Diagnostic test results will improve Outcome: Progressing Goal: Signs and symptoms of infection will decrease Outcome: Progressing   Problem: Respiratory: Goal: Ability to maintain adequate ventilation will improve Outcome: Progressing   Problem: Education: Goal: Knowledge of General Education information will improve Description: Including pain rating scale, medication(s)/side effects and non-pharmacologic comfort measures Outcome: Progressing   Problem: Health Behavior/Discharge Planning: Goal: Ability to manage health-related needs will improve Outcome: Progressing   Problem: Clinical Measurements: Goal: Ability to maintain clinical measurements within normal limits will improve Outcome: Progressing Goal: Will remain free from infection Outcome: Progressing Goal: Diagnostic test results will improve Outcome: Progressing Goal: Respiratory complications will improve Outcome: Progressing Goal: Cardiovascular complication will be avoided Outcome: Progressing   Problem: Activity: Goal: Risk for activity intolerance will decrease Outcome: Progressing   Problem: Nutrition: Goal: Adequate nutrition will be maintained Outcome: Progressing   Problem: Coping: Goal: Level of anxiety will decrease Outcome: Progressing   Problem: Elimination: Goal: Will not experience complications related to bowel motility Outcome: Progressing Goal: Will not experience complications related to  urinary retention Outcome: Progressing   Problem: Pain Managment: Goal: General experience of comfort will improve and/or be controlled Outcome: Progressing   Problem: Safety: Goal: Ability to remain free from injury will improve Outcome: Progressing   Problem: Skin Integrity: Goal: Risk for impaired skin integrity will decrease Outcome: Progressing

## 2023-10-08 NOTE — Progress Notes (Signed)
 Pt given Labetalol  and hydralazine  for systolic of more than 170, neither medication improved hypertension. VS checked multiple times to assess effectiveness.

## 2023-10-08 NOTE — Progress Notes (Signed)
 Spoke with father visiting and mother over phone.  Family pursuing treatment center which he had recently agreed to go.  Appeared to be very engaged and understanding the need for extended treatment. If possible please call with options and suggestions.

## 2023-10-09 DIAGNOSIS — G9341 Metabolic encephalopathy: Secondary | ICD-10-CM | POA: Diagnosis not present

## 2023-10-09 LAB — BASIC METABOLIC PANEL WITH GFR
Anion gap: 11 (ref 5–15)
BUN: 14 mg/dL (ref 6–20)
CO2: 23 mmol/L (ref 22–32)
Calcium: 8.5 mg/dL — ABNORMAL LOW (ref 8.9–10.3)
Chloride: 105 mmol/L (ref 98–111)
Creatinine, Ser: 0.59 mg/dL — ABNORMAL LOW (ref 0.61–1.24)
GFR, Estimated: >60 mL/min (ref >60)
Glucose, Bld: 114 mg/dL — ABNORMAL HIGH (ref 70–99)
Potassium: 3.3 mmol/L — ABNORMAL LOW (ref 3.5–5.1)
Sodium: 139 mmol/L (ref 135–145)

## 2023-10-09 LAB — CBC
HCT: 38.9 % — ABNORMAL LOW (ref 39.0–52.0)
Hemoglobin: 13 g/dL (ref 13.0–17.0)
MCH: 27.7 pg (ref 26.0–34.0)
MCHC: 33.4 g/dL (ref 30.0–36.0)
MCV: 82.8 fL (ref 80.0–100.0)
Platelets: 286 K/uL (ref 150–400)
RBC: 4.7 MIL/uL (ref 4.22–5.81)
RDW: 13.3 % (ref 11.5–15.5)
WBC: 12.2 K/uL — ABNORMAL HIGH (ref 4.0–10.5)
nRBC: 0 % (ref 0.0–0.2)

## 2023-10-09 LAB — CK: Total CK: 415 U/L — ABNORMAL HIGH (ref 49–397)

## 2023-10-09 LAB — MAGNESIUM: Magnesium: 2.2 mg/dL (ref 1.7–2.4)

## 2023-10-09 LAB — PHOSPHORUS: Phosphorus: 2.2 mg/dL — ABNORMAL LOW (ref 2.5–4.6)

## 2023-10-09 MED ORDER — METOPROLOL TARTRATE 5 MG/5ML IV SOLN
10.0000 mg | Freq: Four times a day (QID) | INTRAVENOUS | Status: DC
  Filled 2023-10-09: qty 10

## 2023-10-09 MED ORDER — CHLORHEXIDINE GLUCONATE CLOTH 2 % EX PADS
6.0000 | MEDICATED_PAD | Freq: Every day | CUTANEOUS | Status: DC
Start: 2023-10-09 — End: 2023-10-12
  Administered 2023-10-09 – 2023-10-11 (×3): 6 via TOPICAL

## 2023-10-09 MED ORDER — DEXTROSE 5 % IV SOLN
30.0000 mmol | Freq: Once | INTRAVENOUS | Status: AC
  Administered 2023-10-09: 30 mmol via INTRAVENOUS
  Filled 2023-10-09: qty 10

## 2023-10-09 MED ORDER — HYDRALAZINE HCL 20 MG/ML IJ SOLN
10.0000 mg | INTRAMUSCULAR | Status: DC | PRN
  Administered 2023-10-09 (×2): 10 mg via INTRAVENOUS
  Filled 2023-10-09 (×2): qty 1

## 2023-10-09 NOTE — Plan of Care (Signed)
  Problem: Fluid Volume: Goal: Hemodynamic stability will improve Outcome: Progressing   Problem: Clinical Measurements: Goal: Diagnostic test results will improve Outcome: Progressing Goal: Signs and symptoms of infection will decrease Outcome: Progressing   Problem: Respiratory: Goal: Ability to maintain adequate ventilation will improve Outcome: Progressing   Problem: Education: Goal: Knowledge of General Education information will improve Description: Including pain rating scale, medication(s)/side effects and non-pharmacologic comfort measures Outcome: Progressing   Problem: Health Behavior/Discharge Planning: Goal: Ability to manage health-related needs will improve Outcome: Progressing   Problem: Clinical Measurements: Goal: Ability to maintain clinical measurements within normal limits will improve Outcome: Progressing Goal: Will remain free from infection Outcome: Progressing Goal: Diagnostic test results will improve Outcome: Progressing Goal: Respiratory complications will improve Outcome: Progressing Goal: Cardiovascular complication will be avoided Outcome: Progressing   Problem: Activity: Goal: Risk for activity intolerance will decrease Outcome: Progressing   Problem: Nutrition: Goal: Adequate nutrition will be maintained Outcome: Progressing   Problem: Coping: Goal: Level of anxiety will decrease Outcome: Progressing   Problem: Elimination: Goal: Will not experience complications related to bowel motility Outcome: Progressing Goal: Will not experience complications related to urinary retention Outcome: Progressing   Problem: Pain Managment: Goal: General experience of comfort will improve and/or be controlled Outcome: Progressing   Problem: Safety: Goal: Ability to remain free from injury will improve Outcome: Progressing   Problem: Skin Integrity: Goal: Risk for impaired skin integrity will decrease Outcome: Progressing   Problem:  Safety: Goal: Non-violent Restraint(s) Outcome: Progressing

## 2023-10-09 NOTE — Progress Notes (Signed)
 Dr. Von gave order to hold IV metoprolol  since patient was awake enough to take oral labetalol .
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
## 2023-10-09 NOTE — TOC Progression Note (Addendum)
 Transition of Care [facility]) - Progression Note    Patient Details  Name: Russell Kennedy MRN: [id number] Date of Birth: April 18, 1990  [facility] CM/SW Contact  Racheal LITTIE Schimke, RN Phone Number: 10/09/2023, 3:48 PM  Clinical Narrative: Patient evaluated by Nurse Navigator Annabella Essex at 2 pm, who left resources paperwork for out patient rehab for mother. Sitter remains at bedside with patient.                       Expected Discharge Plan and Services                                               Social Drivers of Health (SDOH) Interventions SDOH Screenings   Tobacco Use: High Risk (10/05/2023)    Readmission Risk Interventions     No data to display

## 2023-10-09 NOTE — Plan of Care (Signed)
  Problem: Fluid Volume: Goal: Hemodynamic stability will improve Outcome: Progressing   Problem: Clinical Measurements: Goal: Signs and symptoms of infection will decrease Outcome: Progressing   Problem: Clinical Measurements: Goal: Respiratory complications will improve Outcome: Progressing Goal: Cardiovascular complication will be avoided Outcome: Progressing   Problem: Nutrition: Goal: Adequate nutrition will be maintained Outcome: Progressing   Problem: Coping: Goal: Level of anxiety will decrease Outcome: Progressing   Problem: Elimination: Goal: Will not experience complications related to bowel motility Outcome: Progressing   Problem: Safety: Goal: Ability to remain free from injury will improve Outcome: Progressing

## 2023-10-09 NOTE — Progress Notes (Signed)
 Patient admitted with alcohol abuse and polysubstance abuse. Patient currently in bilateral upper arm soft restraints with sitter at bedside.  Met with patient at bedside. Introduced patient to role of Statistician. Patient opened yes, but did not respond verbally. Left eye noted to be crusted, bilateral eyes and face washed. Patient then said Thank you. Patient began to cry at this point. Nurse navigator provided emotional support. Informed patient that when/if he was ready to talk, his sitter could message nurse navigator and I would return.  After leaving room, immediately called back to patient's room by sitter who stated he wanted to talk. Patient minimally verbal, alternating between smiling and crying.  When asked about what happened to bring patient to the hospital, he stated I took a bunch of fentanyl . Patient endorsed having used for a long time. He also stated he is currently taking methadone and gets it from the methadone clinic in [place]. Patient also endorses having been to rehab several times. Patient does appear to be having difficulty getting words out, even when actively trying to communicate. Patient encouraged to reach out with any questions or concerns. Will re-visit at a later time to attempt to illicit further communication.

## 2023-10-09 NOTE — Progress Notes (Signed)
 Information for [facility] and nurse navigator's card placed at bedside for patient's mother when she arrives. Bedside nurse informed nurse navigator would be available until 2pm if mother had any questions.

## 2023-10-09 NOTE — Progress Notes (Signed)
 [facility] Progress Note  Patient: Russell Kennedy    FMW:[id number]  DOA: 10/05/2023     Date of Service: the patient was seen and examined on 10/09/2023  Chief Complaint  Patient presents with   Psychiatric Evaluation   Brief hospital course: Russell Kennedy is a 33 y.o. male with medical history significant for Polysubstance abuse including opioid use disorder as well as alcohol use disorder who arrived to the ED today and in the day under IVC due to aggressive behavior and hallucinations being admitted with a fever of uncertain etiology.  Patient was tachycardic and hypertensive(SBP 170s to 180s) on arrival and was initially treated for acute withdrawal. Toxicology revealed EtOH less than 15 and methadone on UDS.  Salicylate less than 7 CBC and CMP notable for WBC 17,000 and potassium 2.8 Urinalysis with ketones and without infection respiratory panel negative. Patient was started on phenobarbital  for withdrawal in the ED. He was also evaluated by psych and started on olanzapine  He received potassium repletion while in the ED Later in the evening on 9/12, he developed a temperature of 100.6.  He had no muscular rigidity concerning for NMS Chest x-ray was clear Patient had prior trauma imaging for fall that included CT head, C-spine, CT right elbow which were all nonacute. Admission requested for fever workup  Patient is currently somnolent from medications administered in the ED and is unable to contribute to history   Assessment and Plan:  # Acute toxic-metabolic encephalopathy # Acute drug withdrawal with complication # Acute alcohol withdrawal with delirium/hallucinations # Polysubstance abuse # Alcohol use disorder Patient reportedly stopped all substances approximately some days prior and since then became agitated and started hallucinating - continue phenobarb -Continue IVC, continue sitter 9/13 fever resolved   9/15 Haldol  IV x 1 dose and Geodon  10 mg IM x 1 dose given  due to acute agitation    # Fever of undetermined origin Possible SIRS Patient with isolated fever and otherwise normal vitals Doubt NMS given low-grade temp, one-time dose of olanzapine  in the ED, no muscle stiffness Low suspicion for meningitis/encephalitis-no meningeal signs on exam Could be SIRS related to agitation Given history of substance use will get blood cultures No identifiable source of infection and low suspicion for meningitis We will keep n.p.o. due to somnolence and in case of decision for diagnostic workup with LP Holding off on antibiotics for now--> procalcitonin<0.1, lactic acid 0.8   # Rhabdomyolysis secondary to drug abuse.  CK 869>>851>> 697>> 415 Continue IV fluid for hydration Trend CK level daily Check renal    # Hypokalemia: Potassium repleted # Hypophosphatemia due to nutritional deficiency.  Phos repleted Check electrolytes daily and replete as needed    # Hypertensive urgency No prior diagnosis of hypertension, elevated BP secondary to withdrawal symptoms and drug abuse.   9/13 started labetalol  200 mg p.o. 3 times daily Use IV labetalol  as needed Monitor BP and titrate medications accordingly  # Urinary retention, In-N-Out catheter was done multiple times. 9/16 indwelling Foley catheter inserted DC Foley catheter before discharge     Body mass index is 35.57 kg/m.  Interventions:  Diet: Regular diet DVT Prophylaxis: Lovenox   Advance goals of care discussion: Full code  Family Communication: family was not present at bedside, at the time of interview.  The pt provided permission to discuss medical plan with the family. Opportunity was given to ask question and all questions were answered satisfactorily.   Disposition:  Pt is from home, admitted with withdrawal symptoms,  polysubstance abuse, IVC by psych, still has electrolyte imbalance and hypertension, which precludes a safe discharge. Discharge to home versus psych, when  stable.   Subjective: No significant events overnight.  Patient was lying comfortably, still patient is not communicating with anyone and patient is not eating or drinking. RN was advised to feed him and offer him food water he wants to eat.   Physical Exam: General: NAD, lying comfortably, sleepy after medication Eyes: Closed ENT: Mouth was close Neck: no JVD,  Cardiovascular: S1 and S2 Present, no Murmur,  Respiratory: good respiratory effort, Bilateral Air entry equal and Decreased, no Crackles, no wheezes Abdomen: Bowel Sound present, Soft and no tenderness,  Skin: no rashes Extremities: no Pedal edema, no calf tenderness Neurologic: without any new focal findings Gait not checked due to patient safety concerns  Vitals:   10/09/23 0848 10/09/23 0933 10/09/23 1006 10/09/23 1143  BP: (!) 176/120 (!) 168/104 (!) 168/104 (!) 139/95  Pulse:  (!) 131 (!) 131 100  Resp:  20  20  Temp:  98.5 F (36.9 C)  98.3 F (36.8 C)  TempSrc:  Axillary  Oral  SpO2:  95%  96%  Weight:      Height:        Intake/Output Summary (Last 24 hours) at 10/09/2023 1420 Last data filed at 10/09/2023 1417 Gross per 24 hour  Intake 3107.63 ml  Output 1700 ml  Net 1407.63 ml   Filed Weights   10/05/23 0105  Weight: 136.1 kg    Data Reviewed: I have personally reviewed and interpreted daily labs, tele strips, imagings as discussed above. I reviewed all nursing notes, pharmacy notes, vitals, pertinent old records I have discussed plan of care as described above with RN and patient/family.  CBC: Recent Labs  Lab 10/02/23 1603 10/05/23 0356 10/06/23 0049 10/07/23 0618 10/08/23 0323 10/09/23 0335  WBC 20.1* 17.3* 17.2* 14.2* 13.6* 12.2*  NEUTROABS 17.8*  --  13.1*  --   --   --   HGB 14.8 14.0 14.1 13.8 12.9* 13.0  HCT 44.9 41.5 42.2 41.7 38.9* 38.9*  MCV 81.5 81.9 81.9 82.1 81.9 82.8  PLT 426* 365 326 274 293 286   Basic Metabolic Panel: Recent Labs  Lab 10/05/23 0331 10/05/23 0356  10/06/23 0049 10/06/23 0600 10/06/23 1220 10/07/23 0618 10/08/23 0323 10/09/23 0335  NA  --    < > 140 140 141 138 137 139  K  --    < > 3.2* 3.2* 3.3* 4.0 3.5 3.3*  CL  --    < > 99 100 101 101 104 105  CO2  --    < > 24 23 25 27 24 23   GLUCOSE  --    < > 117* 115* 124* 119* 104* 114*  BUN  --    < > 12 12 12 14 15 14   CREATININE  --    < > 0.81 0.71 0.77 0.78 0.58* 0.59*  CALCIUM  --    < > 9.0 9.1 9.3 8.9 8.5* 8.5*  MG 2.0  --  1.9  --   --  2.1 2.1 2.2  PHOS  --   --  3.9  --   --  3.3 3.1 2.2*   < > = values in this interval not displayed.    Studies: No results found.   Scheduled Meds:  Chlorhexidine  Gluconate Cloth  6 each Topical Daily   cloNIDine   0.2 mg Transdermal Weekly   enoxaparin  (LOVENOX ) injection  65 mg Subcutaneous QPM   labetalol   200 mg Oral TID   metoprolol  tartrate  10 mg Intravenous Q6H   OLANZapine   5 mg Oral QHS   PHENObarbital   64.8 mg Intravenous Q8H   Followed by   [START ON 10/11/2023] PHENObarbital   32.4 mg Intravenous Q8H   Continuous Infusions:   PRN Meds: acetaminophen  **OR** acetaminophen , haloperidol  lactate, hydrALAZINE , labetalol , ondansetron  **OR** ondansetron  (ZOFRAN ) IV  Time spent: 55 minutes  Author: ELVAN SOR. MD [facility] 10/09/2023 2:20 PM  To reach On-call, see care teams to locate the attending and reach out to them via [email or web address]. If 7PM-7AM, please contact night-coverage If you still have difficulty reaching the attending provider, please page the [facility] (Director on Call) for [facility] on amion for assistance.

## 2023-10-09 NOTE — Progress Notes (Signed)
 Notified Dr. Von that patient has been calmer this afternoon and evening and that restraints have been removed. MD acknowledged and restraints discontinued. Sitter remains at bedside. Patient alert to self and sometimes place. Remains drowsy, follows commands.

## 2023-10-10 DIAGNOSIS — G9341 Metabolic encephalopathy: Secondary | ICD-10-CM | POA: Diagnosis not present

## 2023-10-10 LAB — CBC
HCT: 38.4 % — ABNORMAL LOW (ref 39.0–52.0)
Hemoglobin: 12.4 g/dL — ABNORMAL LOW (ref 13.0–17.0)
MCH: 27.3 pg (ref 26.0–34.0)
MCHC: 32.3 g/dL (ref 30.0–36.0)
MCV: 84.4 fL (ref 80.0–100.0)
Platelets: 246 K/uL (ref 150–400)
RBC: 4.55 MIL/uL (ref 4.22–5.81)
RDW: 13.7 % (ref 11.5–15.5)
WBC: 10.6 K/uL — ABNORMAL HIGH (ref 4.0–10.5)
nRBC: 0 % (ref 0.0–0.2)

## 2023-10-10 LAB — CULTURE, BLOOD (SINGLE): Culture: NO GROWTH

## 2023-10-10 LAB — BASIC METABOLIC PANEL WITH GFR
Anion gap: 16 — ABNORMAL HIGH (ref 5–15)
BUN: 15 mg/dL (ref 6–20)
CO2: 22 mmol/L (ref 22–32)
Calcium: 8.9 mg/dL (ref 8.9–10.3)
Chloride: 102 mmol/L (ref 98–111)
Creatinine, Ser: 0.62 mg/dL (ref 0.61–1.24)
GFR, Estimated: >60 mL/min (ref >60)
Glucose, Bld: 159 mg/dL — ABNORMAL HIGH (ref 70–99)
Potassium: 3.1 mmol/L — ABNORMAL LOW (ref 3.5–5.1)
Sodium: 140 mmol/L (ref 135–145)

## 2023-10-10 LAB — PHOSPHORUS: Phosphorus: 3.2 mg/dL (ref 2.5–4.6)

## 2023-10-10 LAB — CK: Total CK: 255 U/L (ref 49–397)

## 2023-10-10 LAB — MAGNESIUM: Magnesium: 2.2 mg/dL (ref 1.7–2.4)

## 2023-10-10 MED ORDER — POTASSIUM CHLORIDE CRYS ER 20 MEQ PO TBCR
40.0000 meq | EXTENDED_RELEASE_TABLET | ORAL | Status: AC
  Administered 2023-10-10 (×2): 40 meq via ORAL
  Filled 2023-10-10 (×2): qty 2

## 2023-10-10 NOTE — Progress Notes (Signed)
 Pt seen standing in doorway with mobility specialist, pt lethargic, unable to manage secretions (drooling), not verbally responding or appropriately alert. OT provided assist for chair to be placed behind patient for safety, brought recliner and assisted mobility specialist with safe repositioning / return to seated position in recliner. Updated charge RN and pt's RN on pt status. Pt left with sitter and MS in room.    Azucena Dart L. Kennley Schwandt, OTR/L  10/10/23, 4:15 PM

## 2023-10-10 NOTE — Progress Notes (Signed)
   10/10/23 1400  Mobility  Activity Ambulated with assistance;Stood at bedside;Respositioned in chair  Level of Assistance Moderate assist, patient does 50-74%  Assistive Device Front wheel walker  Distance Ambulated (ft) 15 ft  Range of Motion/Exercises Passive  Activity Response Tolerated fair  Mobility Referral Yes  Mobility visit 1 Mobility  Mobility Specialist Start Time (ACUTE ONLY) 1334  Mobility Specialist Stop Time (ACUTE ONLY) 1353  Mobility Specialist Time Calculation (min) (ACUTE ONLY) 19 min   Mobility Specialist - Progress Note Pt was supine in bed upon entry. Pt was referred by lead nurse for mobility. Pt also agreed to mobility. Pt was able to EOB independently. Pt was able to STS with CGA. Pt did not complain about pain. Pt began ambulation well but began to decline in mobility. Pt was able to reposition in chair. PT was able to assist with chair follow and repositioning. Pt has needs in reach.  Russell Kennedy Mobility Specialist 10/10/23, 2:09 PM

## 2023-10-10 NOTE — Progress Notes (Signed)
 Attempted to round back on patient. Patient OOB and in bathroom.

## 2023-10-10 NOTE — Progress Notes (Signed)
 Met with patient at bedside. 1:1 sitter remains. Patient no longer in soft wrist restraints.  Patient answering questions with short answers. Verbalization remains slowed and garbled at times.  Patient again begins to cry when discussing his mom and rehab.  Patient endorses having an upcoming court date for his pending charges. Aware substance use rehabilitation centers may not agree to admit him until charges are cleared.  Patient encouraged to eat breakfast this morning. Nurse navigator to follow up later today.

## 2023-10-10 NOTE — Plan of Care (Signed)
 Patient has been drowsy but oriented to person and place today.  Walked and transferred to chair 2 times. [place] improving also.  Mother at bedside and very supportive.  Reports she is working to find and arrange rehab currently.

## 2023-10-10 NOTE — Progress Notes (Signed)
 [facility] Progress Note  Patient: Russell Kennedy    FMW:[id number]  DOA: 10/05/2023     Date of Service: the patient was seen and examined on 10/10/2023  Chief Complaint  Patient presents with   Psychiatric Evaluation   Brief hospital course: Euell Schiff is a 33 y.o. male with medical history significant for Polysubstance abuse including opioid use disorder as well as alcohol use disorder who arrived to the ED today and in the day under IVC due to aggressive behavior and hallucinations being admitted with a fever of uncertain etiology.  Patient was tachycardic and hypertensive(SBP 170s to 180s) on arrival and was initially treated for acute withdrawal. Toxicology revealed EtOH less than 15 and methadone on UDS.  Salicylate less than 7 CBC and CMP notable for WBC 17,000 and potassium 2.8 Urinalysis with ketones and without infection respiratory panel negative. Patient was started on phenobarbital  for withdrawal in the ED. He was also evaluated by psych and started on olanzapine  He received potassium repletion while in the ED Later in the evening on 9/12, he developed a temperature of 100.6.  He had no muscular rigidity concerning for NMS Chest x-ray was clear Patient had prior trauma imaging for fall that included CT head, C-spine, CT right elbow which were all nonacute. Admission requested for fever workup  Patient is currently somnolent from medications administered in the ED and is unable to contribute to history   Assessment and Plan:  # Acute toxic-metabolic encephalopathy # Acute drug withdrawal with complication # Acute alcohol withdrawal with delirium/hallucinations # Polysubstance abuse # Alcohol use disorder Patient reportedly stopped all substances approximately some days prior and since then became agitated and started hallucinating - continue phenobarb -Continue IVC, continue sitter 9/13 fever resolved   9/15 Haldol  IV x 1 dose and Geodon  10 mg IM x 1 dose given  due to acute agitation    # Fever of undetermined origin Possible SIRS Patient with isolated fever and otherwise normal vitals Doubt NMS given low-grade temp, one-time dose of olanzapine  in the ED, no muscle stiffness Low suspicion for meningitis/encephalitis-no meningeal signs on exam Could be SIRS related to agitation blood cultures NGTD No identifiable source of infection and low suspicion for meningitis Holding off on antibiotics for now--> procalcitonin<0.1, lactic acid 0.8   # Rhabdomyolysis secondary to drug abuse.  CK 869>>851>> 697>> 415>> 255 Continue IV fluid for hydration Trend CK level daily Check renal    # Hypokalemia: Potassium repleted # Hypophosphatemia due to nutritional deficiency.  Phos repleted Check electrolytes daily and replete as needed    # Hypertensive urgency No prior diagnosis of hypertension, elevated BP secondary to withdrawal symptoms and drug abuse.   9/13 started labetalol  200 mg p.o. 3 times daily 9/15 started clonidine , because pt was not able to take po meds and required multiple doses of IV labetalol . Use IV labetalol  as needed Monitor BP and titrate medications accordingly  # Urinary retention, In-N-Out catheter was done multiple times. 9/16 indwelling Foley catheter inserted DC Foley catheter before discharge     Body mass index is 35.57 kg/m.  Interventions:  Diet: Regular diet DVT Prophylaxis: Lovenox   Advance goals of care discussion: Full code  Family Communication: family was not present at bedside, at the time of interview.  The pt provided permission to discuss medical plan with the family. Opportunity was given to ask question and all questions were answered satisfactorily.   Disposition:  Pt is from home, admitted with withdrawal symptoms, polysubstance abuse, IVC  by psych, still has electrolyte imbalance and hypertension, which precludes a safe discharge. Discharge to psych unit, when medically  stable.   Subjective: No significant events overnight.  Patient was lying comfortably, still pt is very groggy, and sleepy.  Denies any complains    Physical Exam: General: NAD, lying comfortably, sleepy  Eyes: PERRLA  ENT: Mouth clear, moist  Neck: no JVD,  Cardiovascular: S1 and S2 Present, no Murmur,  Respiratory: good respiratory effort, Bilateral Air entry equal and Decreased, no Crackles, no wheezes Abdomen: Bowel Sound present, Soft and no tenderness,  Skin: no rashes Extremities: no Pedal edema, no calf tenderness Neurologic: without any new focal findings Gait not checked due to patient safety concerns  Vitals:   10/09/23 2343 10/10/23 0355 10/10/23 0811 10/10/23 1149  BP: (!) 134/98 131/78 (!) 155/96 (!) 151/104  Pulse: (!) 104 (!) 105 80 88  Resp: 20 20 20 20   Temp: 97.9 F (36.6 C) 97.9 F (36.6 C) 98 F (36.7 C) 98 F (36.7 C)  TempSrc: Oral Oral    SpO2: 98% 98% 96% 97%  Weight:      Height:        Intake/Output Summary (Last 24 hours) at 10/10/2023 1444 Last data filed at 10/10/2023 1400 Gross per 24 hour  Intake 480 ml  Output 775 ml  Net -295 ml   Filed Weights   10/05/23 0105  Weight: 136.1 kg    Data Reviewed: I have personally reviewed and interpreted daily labs, tele strips, imagings as discussed above. I reviewed all nursing notes, pharmacy notes, vitals, pertinent old records I have discussed plan of care as described above with RN and patient/family.  CBC: Recent Labs  Lab 10/06/23 0049 10/07/23 0618 10/08/23 0323 10/09/23 0335 10/10/23 0547  WBC 17.2* 14.2* 13.6* 12.2* 10.6*  NEUTROABS 13.1*  --   --   --   --   HGB 14.1 13.8 12.9* 13.0 12.4*  HCT 42.2 41.7 38.9* 38.9* 38.4*  MCV 81.9 82.1 81.9 82.8 84.4  PLT 326 274 293 286 246   Basic Metabolic Panel: Recent Labs  Lab 10/06/23 0049 10/06/23 0600 10/06/23 1220 10/07/23 0618 10/08/23 0323 10/09/23 0335 10/10/23 0547  NA 140   < > 141 138 137 139 140  K 3.2*   < >  3.3* 4.0 3.5 3.3* 3.1*  CL 99   < > 101 101 104 105 102  CO2 24   < > 25 27 24 23 22   GLUCOSE 117*   < > 124* 119* 104* 114* 159*  BUN 12   < > 12 14 15 14 15   CREATININE 0.81   < > 0.77 0.78 0.58* 0.59* 0.62  CALCIUM 9.0   < > 9.3 8.9 8.5* 8.5* 8.9  MG 1.9  --   --  2.1 2.1 2.2 2.2  PHOS 3.9  --   --  3.3 3.1 2.2* 3.2   < > = values in this interval not displayed.    Studies: No results found.   Scheduled Meds:  Chlorhexidine  Gluconate Cloth  6 each Topical Daily   cloNIDine   0.2 mg Transdermal Weekly   enoxaparin  (LOVENOX ) injection  65 mg Subcutaneous QPM   labetalol   200 mg Oral TID   OLANZapine   5 mg Oral QHS   PHENObarbital   64.8 mg Intravenous Q8H   Followed by   NOREEN ON 10/11/2023] PHENObarbital   32.4 mg Intravenous Q8H   Continuous Infusions:   PRN Meds: acetaminophen  **OR** acetaminophen , haloperidol  lactate,  hydrALAZINE , labetalol , ondansetron  **OR** ondansetron  (ZOFRAN ) IV  Time spent: 42 minutes  Author: ELVAN SOR. MD [facility] 10/10/2023 2:44 PM  To reach On-call, see care teams to locate the attending and reach out to them via [email or web address]. If 7PM-7AM, please contact night-coverage If you still have difficulty reaching the attending provider, please page the [facility] (Director on Call) for [facility] on amion for assistance.

## 2023-10-11 DIAGNOSIS — G9341 Metabolic encephalopathy: Secondary | ICD-10-CM | POA: Diagnosis not present

## 2023-10-11 LAB — BASIC METABOLIC PANEL WITH GFR
Anion gap: 9 (ref 5–15)
BUN: 12 mg/dL (ref 6–20)
CO2: 25 mmol/L (ref 22–32)
Calcium: 8.8 mg/dL — ABNORMAL LOW (ref 8.9–10.3)
Chloride: 106 mmol/L (ref 98–111)
Creatinine, Ser: 0.72 mg/dL (ref 0.61–1.24)
GFR, Estimated: >60 mL/min (ref >60)
Glucose, Bld: 116 mg/dL — ABNORMAL HIGH (ref 70–99)
Potassium: 3.2 mmol/L — ABNORMAL LOW (ref 3.5–5.1)
Sodium: 140 mmol/L (ref 135–145)

## 2023-10-11 LAB — MAGNESIUM: Magnesium: 2.2 mg/dL (ref 1.7–2.4)

## 2023-10-11 LAB — CULTURE, BLOOD (ROUTINE X 2)
Culture: NO GROWTH
Culture: NO GROWTH
Special Requests: ADEQUATE

## 2023-10-11 LAB — CBC
HCT: 36.1 % — ABNORMAL LOW (ref 39.0–52.0)
Hemoglobin: 12 g/dL — ABNORMAL LOW (ref 13.0–17.0)
MCH: 27.7 pg (ref 26.0–34.0)
MCHC: 33.2 g/dL (ref 30.0–36.0)
MCV: 83.4 fL (ref 80.0–100.0)
Platelets: 279 K/uL (ref 150–400)
RBC: 4.33 MIL/uL (ref 4.22–5.81)
RDW: 13.6 % (ref 11.5–15.5)
WBC: 9.5 K/uL (ref 4.0–10.5)
nRBC: 0 % (ref 0.0–0.2)

## 2023-10-11 LAB — CK: Total CK: 154 U/L (ref 49–397)

## 2023-10-11 LAB — PHOSPHORUS: Phosphorus: 3.9 mg/dL (ref 2.5–4.6)

## 2023-10-11 MED ORDER — LABETALOL HCL 200 MG PO TABS
300.0000 mg | ORAL_TABLET | Freq: Three times a day (TID) | ORAL | Status: DC
  Administered 2023-10-11 – 2023-10-15 (×12): 300 mg via ORAL
  Filled 2023-10-11 (×13): qty 1

## 2023-10-11 MED ORDER — POTASSIUM CHLORIDE 20 MEQ PO PACK
40.0000 meq | PACK | Freq: Once | ORAL | Status: AC
  Administered 2023-10-11: 40 meq via ORAL
  Filled 2023-10-11: qty 2

## 2023-10-11 MED ORDER — POTASSIUM CHLORIDE CRYS ER 20 MEQ PO TBCR
40.0000 meq | EXTENDED_RELEASE_TABLET | Freq: Once | ORAL | Status: DC
  Filled 2023-10-11: qty 2

## 2023-10-11 NOTE — Progress Notes (Signed)
 [facility]  - [place] [facility]   PATIENT NAME: Russell Kennedy    MR#:  [id number]  DATE OF BIRTH:  1991/01/15  SUBJECTIVE:  sitter at bedside patient seen earlier. Received Haldol  unable to have meaningful conversation. Open size to verbal commands however falls asleep.    VITALS:  Blood pressure (!) 165/114, pulse 89, temperature 98.4 F (36.9 C), resp. rate 20, height 6' 5 (1.956 m), weight 136.1 kg, SpO2 98%.  PHYSICAL EXAMINATION:  limited due to sedation GENERAL:  33 y.o.-year-old patient with no acute distress.  LUNGS: Normal breath sounds bilaterally CARDIOVASCULAR: S1, S2 normal. No murmur   ABDOMEN: Soft, nontender, nondistended.  EXTREMITIES: No  edema b/l.    NEUROLOGIC: sleeping  LABORATORY PANEL:  CBC Recent Labs  Lab 10/11/23 0528  WBC 9.5  HGB 12.0*  HCT 36.1*  PLT 279    Chemistries  Recent Labs  Lab 10/06/23 0049 10/06/23 0600 10/11/23 0528  NA 140   < > 140  K 3.2*   < > 3.2*  CL 99   < > 106  CO2 24   < > 25  GLUCOSE 117*   < > 116*  BUN 12   < > 12  CREATININE 0.81   < > 0.72  CALCIUM 9.0   < > 8.8*  MG 1.9   < > 2.2  AST 25  --   --   ALT 19  --   --   ALKPHOS 52  --   --   BILITOT 1.2  --   --    < > = values in this interval not displayed.    Russell Kennedy is a 33 y.o. male with medical history significant for Polysubstance abuse including opioid use disorder as well as alcohol use disorder who arrived to the ED today and in the day under IVC due to aggressive behavior and hallucinations being admitted with a fever of uncertain etiology.  Patient was tachycardic and hypertensive(SBP 170s to 180s) on arrival and was initially treated for acute withdrawal. Toxicology revealed EtOH less than 15 and methadone on UDS.  Salicylate less than 7   Assessment and Plan:   # Acute toxic-metabolic encephalopathy # Acute drug withdrawal with complication # Acute alcohol withdrawal with delirium/hallucinations #  Polysubstance abuse # Alcohol use disorder Patient reportedly stopped all substances approximately some days prior and since then became agitated and started hallucinating - continue phenobarb -Continue IVC, continue sitter 9/13 fever resolved   9/15 Haldol  IV x 1 dose and Geodon  10 mg IM x 1 dose given due to acute agitation --9/18-- sitter at bedside. Patient was a bit agitated with some tremors. RN gave Haldol . Not able to have meaningful conversation today. No family at bedside per RN patient walked around the nurses station.  -- Psych input appreciated.   # Fever of undetermined origin-- resolved Could be SIRS related to agitation blood cultures NGTD No identifiable source of infection and low suspicion for meningitis Holding off on antibiotics for now--> procalcitonin<0.1, lactic acid 0.8   # Rhabdomyolysis secondary to drug abuse.  CK 869>>851>> 697>> 415>> 255 patient encouraged to drink oral fluids\  # Hypokalemia: Potassium repleted # Hypophosphatemia due to nutritional deficiency.  Phos repleted Check electrolytes daily and replete as needed     # Hypertensive urgency No prior diagnosis of hypertension, elevated BP secondary to withdrawal symptoms and drug abuse.   Will adjust BP meds  # Urinary retention, In-N-Out catheter was done  multiple times. 9/16 indwelling Foley catheter inserted 9/18-- per RN patient ambulating to the bathroom and outside of the nurses station. Will DC Foley. Encourage patient to use bathroom/urine           Procedures: Family communication :none Consults : psych CODE STATUS: full DVT Prophylaxis : enoxaparin  Level of care: Progressive Status is: Inpatient Remains inpatient appropriate because: altered mental status    TOTAL TIME TAKING CARE OF THIS PATIENT: 35 minutes.  >50% time spent on counselling and coordination of care  Note: This dictation was prepared with Dragon dictation along with smaller phrase technology. Any  transcriptional errors that result from this process are unintentional.  Leita Blanch M.D    [facility]   CC: Primary care physician; Patient, No Pcp Per

## 2023-10-11 NOTE — Progress Notes (Signed)
   10/11/23 1600  Oxygen Therapy  SpO2 98 %  O2 Device Room Air  Mobility  Activity Ambulated with assistance;Stood at bedside;Respositioned in chair  Level of Assistance Contact guard assist, steadying assist  Assistive Device Front wheel walker  Distance Ambulated (ft) 165 ft  Range of Motion/Exercises Active Assistive;All extremities  Activity Response Tolerated well  Mobility Referral Yes  Mobility visit 1 Mobility  Mobility Specialist Start Time (ACUTE ONLY) 0400  Mobility Specialist Stop Time (ACUTE ONLY) 0420  Mobility Specialist Time Calculation (min) (ACUTE ONLY) 20 min   Mobility Specialist - Progress Note Pt was supine position in the bed upon entry. Pt agreed to mobility. Pt was able to EOB independently. Pt was able to STS independently. Pt ambulated with 2 [employer]. Pt did have moments of spatial awareness fatigue and vertigo at times but stood tall. Pt was able to concentrate on task but has a delay cognitively. After activity pt was able to be repositioned in chair with needs in close reach. Nurse was in room upon completion.  Clem Rodes Mobility Specialist 10/11/23, 4:26 PM

## 2023-10-11 NOTE — Progress Notes (Signed)
 Patient was resting in bed when this interviewer entered room to assess patient. Patient unable to participate in assessment at this time. Per chart review of nursing notes, it appears patient mental status is improving some and he is responding more to staff. Patient does still have periods of disorientation, delayed responses and garbled speech. Mother is continuing to work on finding rehabilitation services for patient. At this time, ability to perform full safety and psychiatric exam is limited. Due to patient substance abuse, presentation and behaviors while being admitted to the hospital, there is an elevated risk of harm to self and others. Will plan to renew the IVC.

## 2023-10-11 NOTE — Progress Notes (Signed)
 Met with patient at bedside following ambulating in hallway with use of walker. 1:1 sitter remains. Patient with less flat affect today, smiling and interacting a bit more.  Patient reports he is eating better. Asking about how long he will have the catheter. Bedside RN made aware. Patient continues to have some delayed responses and garbled speech at times. Drooling noted.  Patient endorses still wanting to complete rehabilitation when able.

## 2023-10-11 NOTE — Plan of Care (Signed)
  Problem: Respiratory: Goal: Ability to maintain adequate ventilation will improve Outcome: Adequate for Discharge   Problem: Education: Goal: Knowledge of General Education information will improve Description: Including pain rating scale, medication(s)/side effects and non-pharmacologic comfort measures Outcome: Progressing   Problem: Activity: Goal: Risk for activity intolerance will decrease Outcome: Progressing   Problem: Nutrition: Goal: Adequate nutrition will be maintained Outcome: Progressing   Problem: Coping: Goal: Level of anxiety will decrease Outcome: Adequate for Discharge

## 2023-10-11 NOTE — Progress Notes (Signed)
   10/11/23 1900  Vitals  Temp 98.1 F (36.7 C)  Temp Source Axillary  BP (!) 158/99  BP Location Left Arm  BP Method Automatic  Patient Position (if appropriate) Lying  Pulse Rate 82  ECG Heart Rate 98  Resp (!) 31  MEWS COLOR  MEWS Score Color Yellow  Oxygen Therapy  SpO2 99 %  O2 Device Room Air  MEWS Score  MEWS Temp 0  MEWS Systolic 0  MEWS Pulse 0  MEWS RR 2  MEWS LOC 0  MEWS Score 2  Provider Notification  Provider Name/Title Mancy  Date Provider Notified 10/11/23  Time Provider Notified 2007  Method of Notification Page  Notification Reason Other (Comment) ([place]ellow [place])  Provider response No new orders

## 2023-10-11 NOTE — TOC Progression Note (Signed)
 Transition of Care [facility]) - Progression Note    Patient Details  Name: Russell Kennedy MRN: [id number] Date of Birth: 1991/01/20  Transition of Care [facility]) CM/SW Contact  Lauraine JAYSON Carpen, LCSW Phone Number: 10/11/2023, 2:52 PM  Clinical Narrative:   TOC continues to follow progress.  Expected Discharge Plan and Services                                               Social Drivers of Health (SDOH) Interventions SDOH Screenings   Tobacco Use: High Risk (10/05/2023)    Readmission Risk Interventions     No data to display

## 2023-10-12 DIAGNOSIS — G9341 Metabolic encephalopathy: Secondary | ICD-10-CM | POA: Diagnosis not present

## 2023-10-12 LAB — PHOSPHORUS: Phosphorus: 3.9 mg/dL (ref 2.5–4.6)

## 2023-10-12 LAB — POTASSIUM: Potassium: 3.4 mmol/L — ABNORMAL LOW (ref 3.5–5.1)

## 2023-10-12 MED ORDER — PHENOBARBITAL 32.4 MG PO TABS
32.4000 mg | ORAL_TABLET | Freq: Three times a day (TID) | ORAL | Status: AC
  Administered 2023-10-12 – 2023-10-13 (×5): 32.4 mg via ORAL
  Filled 2023-10-12 (×5): qty 1

## 2023-10-12 MED ORDER — LISINOPRIL 10 MG PO TABS
10.0000 mg | ORAL_TABLET | Freq: Every day | ORAL | Status: DC
Start: 2023-10-12 — End: 2023-10-15
  Administered 2023-10-12 – 2023-10-15 (×4): 10 mg via ORAL
  Filled 2023-10-12 (×4): qty 1

## 2023-10-12 MED ORDER — AMLODIPINE BESYLATE 5 MG PO TABS
5.0000 mg | ORAL_TABLET | Freq: Every day | ORAL | Status: DC
  Administered 2023-10-12 – 2023-10-14 (×3): 5 mg via ORAL
  Filled 2023-10-12 (×3): qty 1

## 2023-10-12 MED ORDER — POTASSIUM CHLORIDE 20 MEQ PO PACK
40.0000 meq | PACK | Freq: Once | ORAL | Status: AC
  Administered 2023-10-12: 40 meq via ORAL
  Filled 2023-10-12 (×2): qty 2

## 2023-10-12 MED ORDER — HALOPERIDOL LACTATE 5 MG/ML IJ SOLN
2.0000 mg | Freq: Three times a day (TID) | INTRAMUSCULAR | Status: DC | PRN
  Administered 2023-10-13: 2 mg via INTRAVENOUS
  Filled 2023-10-12: qty 1

## 2023-10-12 NOTE — BH Assessment (Signed)
 PT PLACED UNDER  2ND  SET OF   IVC PAPERS  INFORMED  DAPHNE GREET RN
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
## 2023-10-12 NOTE — Plan of Care (Signed)
  Problem: Fluid Volume: Goal: Hemodynamic stability will improve Outcome: Progressing   Problem: Clinical Measurements: Goal: Diagnostic test results will improve Outcome: Progressing   Problem: Clinical Measurements: Goal: Signs and symptoms of infection will decrease Outcome: Progressing   Problem: Respiratory: Goal: Ability to maintain adequate ventilation will improve Outcome: Progressing   Problem: Education: Goal: Knowledge of General Education information will improve Description: Including pain rating scale, medication(s)/side effects and non-pharmacologic comfort measures Outcome: Progressing

## 2023-10-12 NOTE — Progress Notes (Signed)
 Report given to 1 C nurse. Patient informed he was going to be moving to 1 C shortly. Patient had no questions or concerns.

## 2023-10-12 NOTE — Progress Notes (Signed)
 Patients parents at bedside. Informed patient and parents patient will be transferred to 1 C room 112 when room is cleaned. Parents also asked about when MRI was going to be completed. Writer called MRI with no answer. Parents notified. Will attempt to call MRI again before transfer.  Safety sitter at bedside.

## 2023-10-12 NOTE — Progress Notes (Signed)
   10/12/23 1600  Mobility  Activity Ambulated with assistance;Stood at bedside;Respositioned in chair  Level of Assistance Moderate assist, patient does 50-74%  Assistive Device Front wheel walker  Distance Ambulated (ft) 25 ft  Activity Response Tolerated fair  Mobility Referral Yes  Mobility visit 1 Mobility  Mobility Specialist Start Time (ACUTE ONLY) 1538  Mobility Specialist Stop Time (ACUTE ONLY) 1603  Mobility Specialist Time Calculation (min) (ACUTE ONLY) 25 min   Mobility Specialist - Progress Note Pt was supine in bed with a sitter upon entry. Pt and sitter agreed to mobility. Pt was able to EOB with cues for positioning. Pt was able to EOB again with cues due to periods of disorientation. Pt was able to STS independently with cues. Pt was able to ambulate with 2 WW and directional assistance. Pt during ambulation instructed that he needed a chair and the sitting nurse was able to assist with bring a chair. After recovery pt was able to ambulate to the recliner. Pt was in the recliner with needs in reach. Nurse was in the room.  Clem Rodes Mobility Specialist 10/12/23, 4:26 PM

## 2023-10-12 NOTE — Progress Notes (Signed)
 [facility]  - [place] [facility]   PATIENT NAME: Russell Kennedy    MR#:  [id number]  DATE OF BIRTH:  01-18-1991  SUBJECTIVE:  sitter at bedside  met with patient's mother at bedside. Patient more control received today. Tolerated PO diet. Has been walking around nurses station. Nursing staff took patient outside for fresh air. No issues with urine output.   VITALS:  Blood pressure (!) 127/90, pulse 97, temperature 98.2 F (36.8 C), resp. rate 20, height 6' 5 (1.956 m), weight 117.5 kg, SpO2 97%.  PHYSICAL EXAMINATION:   GENERAL:  33 y.o.-year-old patient with no acute distress.  LUNGS: Normal breath sounds bilaterally CARDIOVASCULAR: S1, S2 normal. No murmur   ABDOMEN: Soft, nontender, nondistended.  EXTREMITIES: No  edema b/l.    NEUROLOGIC: sleeping  LABORATORY PANEL:  CBC Recent Labs  Lab 10/11/23 0528  WBC 9.5  HGB 12.0*  HCT 36.1*  PLT 279    Chemistries  Recent Labs  Lab 10/06/23 0049 10/06/23 0600 10/11/23 0528 10/12/23 0329  NA 140   < > 140  --   K 3.2*   < > 3.2* 3.4*  CL 99   < > 106  --   CO2 24   < > 25  --   GLUCOSE 117*   < > 116*  --   BUN 12   < > 12  --   CREATININE 0.81   < > 0.72  --   CALCIUM 9.0   < > 8.8*  --   MG 1.9   < > 2.2  --   AST 25  --   --   --   ALT 19  --   --   --   ALKPHOS 52  --   --   --   BILITOT 1.2  --   --   --    < > = values in this interval not displayed.    Russell Kennedy is a 33 y.o. male with medical history significant for Polysubstance abuse including opioid use disorder as well as alcohol use disorder who arrived to the ED today and in the day under IVC due to aggressive behavior and hallucinations being admitted with a fever of uncertain etiology.  Patient was tachycardic and hypertensive(SBP 170s to 180s) on arrival and was initially treated for acute withdrawal. Toxicology revealed EtOH less than 15 and methadone on UDS.  Salicylate less than 7   Assessment and Plan:   Acute  toxic-metabolic encephalopathy  Acute drug withdrawal with complication Acute alcohol withdrawal with delirium/hallucinations  Polysubstance abuse Alcohol use disorder Patient reportedly stopped all substances approximately some days prior and since then became agitated and started hallucinating --continue phenobarb taper --pt on IVC, continue sitter 9/13 fever resolved   9/15 Haldol  IV x 1 dose and Geodon  10 mg IM x 1 dose given due to acute agitation --9/18-- sitter at bedside. Patient was a bit agitated with some tremors. RN gave Haldol . Not able to have meaningful conversation today. No family at bedside per RN patient walked around the nurses station.  -- Psych input appreciated. --9/19-- ambulating around the nurses station. Patient was taken outside for walk with staff. Tolerating PO diet. Met with mom. Does have. Of mild confusion. Slow to respond but answers most questions appropriately blood pressure much improved. Medications adjusted. Added lisinopril  and amlodipine     Fever of undetermined origin-- resolved --blood cultures NGTD No identifiable source of infection and low suspicion for meningitis  Holding off on antibiotics for now--> procalcitonin<0.1, lactic acid 0.8    Rhabdomyolysis secondary to drug abuse.  CK 869>>851>> 697>> 415>> 255 patient encouraged to drink oral fluids\   Hypokalemia: Potassium repleted Hypophosphatemia due to nutritional deficiency.  Phos repleted D/c tele    Hypertensive urgency No prior diagnosis of hypertension, elevated BP secondary to withdrawal symptoms and drug abuse.  --Will adjust BP meds --cont present meds   Urinary retention, In-N-Out catheter was done multiple times. 9/16 indwelling Foley catheter inserted 9/18-- per RN patient ambulating to the bathroom and outside of the nurses station. Will DC Foley. Encourage patient to use bathroom/urine --no issues with urination    Family communication :mother at bedside Consults :  psych CODE STATUS: full DVT Prophylaxis : enoxaparin  Level of care: Med-Surg Status is: Inpatient Remains inpatient appropriate because: monitoring BP and overall mentation. Await Psych input regarding possible transfer to BHU    TOTAL TIME TAKING CARE OF THIS PATIENT: 35 minutes.  >50% time spent on counselling and coordination of care  Note: This dictation was prepared with Dragon dictation along with smaller phrase technology. Any transcriptional errors that result from this process are unintentional.  Leita Blanch M.D    [facility]   CC: Primary care physician; Patient, No Pcp Per

## 2023-10-13 ENCOUNTER — Inpatient Hospital Stay: Payer: MEDICAID

## 2023-10-13 DIAGNOSIS — G9341 Metabolic encephalopathy: Secondary | ICD-10-CM | POA: Diagnosis not present

## 2023-10-13 NOTE — Progress Notes (Signed)
 [facility]  - [place] at [facility]   PATIENT NAME: Russell Kennedy    MR#:  [id number]  DATE OF BIRTH:  [birth year]  SUBJECTIVE:  sitter at bedside  met with patient's girlfriend at bedside. Patient appears to be in better spirits. He is smiling. He ate breakfast. He is requesting to go out for fresh air. Discussed with Sater and charge nurse at bedside and will try to do it later after lunch. Patient agreeable. Answered all basic questions appropriately.  VITALS:  Blood pressure (!) 146/100, pulse 99, temperature 99 F (37.2 C), temperature source Oral, resp. rate 18, height 6' 5 (1.956 m), weight 117.5 kg, SpO2 99%.  PHYSICAL EXAMINATION:   GENERAL:  33 y.o.-year-old patient with no acute distress.  LUNGS: Normal breath sounds bilaterally CARDIOVASCULAR: S1, S2 normal. No murmur   ABDOMEN: Soft, nontender, nondistended.  EXTREMITIES: No  edema b/l.    NEUROLOGIC: alert and oriented times 2  LABORATORY PANEL:  CBC Recent Labs  Lab 10/11/23 0528  WBC 9.5  HGB 12.0*  HCT 36.1*  PLT 279    Chemistries  Recent Labs  Lab 10/11/23 0528 10/12/23 0329  NA 140  --   K 3.2* 3.4*  CL 106  --   CO2 25  --   GLUCOSE 116*  --   BUN 12  --   CREATININE 0.72  --   CALCIUM 8.8*  --   MG 2.2  --     Russell Kennedy is a 33 y.o. male with medical history significant for Polysubstance abuse including opioid use disorder as well as alcohol use disorder who arrived to the ED today and in the day under IVC due to aggressive behavior and hallucinations being admitted with a fever of uncertain etiology.  Patient was tachycardic and hypertensive(SBP 170s to 180s) on arrival and was initially treated for acute withdrawal. Toxicology revealed EtOH less than 15 and methadone on UDS.  Salicylate less than 7   Assessment and Plan:   Acute toxic-metabolic encephalopathy Acute drug withdrawal  Acute alcohol withdrawal with delirium/hallucinations--improving Polysubstance  abuse Alcohol use disorder --Patient reportedly stopped all substances approximately some days prior and since then became agitated and started hallucinating --continue phenobarb taper --pt on IVC, continue sitter 9/13 fever resolved   9/15 Haldol  IV x 1 dose and Geodon  10 mg IM x 1 dose given due to acute agitation --9/18-- sitter at bedside. Patient was a bit agitated with some tremors. RN gave Haldol . Not able to have meaningful conversation today. No family at bedside per RN patient walked around the nurses station.  -- Psych input appreciated. --9/19-- ambulating around the nurses station. Patient was taken outside for walk with staff. Tolerating PO diet. Met with mom. Does have. Of mild confusion. Slow to respond but answers most questions appropriately blood pressure much improved. Medications adjusted. Added lisinopril  and amlodipine  --9/19-- patient's girlfriend at bedside. He appears to be in better spirits. Smiling. Tolerating PO diet. Requesting to go outside for fresh air. MRI brain nothing acute discussed results with girlfriend.    Fever of undetermined origin-- resolved --blood cultures NGTD No identifiable source of infection and low suspicion for meningitis Holding off on antibiotics for now--> procalcitonin<0.1, lactic acid 0.8    Rhabdomyolysis secondary to drug abuse.  CK 869>>851>> 697>> 415>> 255 patient encouraged to drink oral fluids\   Hypokalemia: Potassium repleted Hypophosphatemia due to nutritional deficiency.  Phos repleted D/c tele   Hypertensive urgency No prior diagnosis of hypertension, elevated  BP secondary to withdrawal symptoms and drug abuse.  --Will adjust BP meds --cont present meds  Urinary retention, In-N-Out catheter was done multiple times. 9/16 indwelling Foley catheter inserted 9/18-- per RN patient ambulating to the bathroom and outside of the nurses station. Will DC Foley. Encourage patient to use bathroom/urine --no issues with  urination    Family communication :GF at bedside Consults : psych CODE STATUS: full DVT Prophylaxis : enoxaparin  Level of care: Med-Surg Status is: Inpatient Remains inpatient appropriate because: monitoring BP and overall mentation. Await Psych input regarding possible transfer to BHU    TOTAL TIME TAKING CARE OF THIS PATIENT: 35 minutes.  >50% time spent on counselling and coordination of care  Note: This dictation was prepared with Dragon dictation along with smaller phrase technology. Any transcriptional errors that result from this process are unintentional.  Russell Kennedy M.D    [facility]   CC: Primary care physician; Patient, No Pcp Per

## 2023-10-13 NOTE — Plan of Care (Signed)
  Problem: Education: Goal: Knowledge of General Education information will improve Description: Including pain rating scale, medication(s)/side effects and non-pharmacologic comfort measures Outcome: Progressing   Problem: Pain Managment: Goal: General experience of comfort will improve and/or be controlled Outcome: Progressing   Problem: Safety: Goal: Ability to remain free from injury will improve Outcome: Progressing   Problem: Pain Managment: Goal: General experience of comfort will improve and/or be controlled Outcome: Progressing

## 2023-10-13 NOTE — Progress Notes (Signed)
 Mobility notified me that patient had a jump in HR to 130 while trying to stand patient. With BP-138/101, temp 99.1 (with tylenol  given Q6) Patient stating he feeling shaky. Pain in the back of his head. Dr. Tobie notified via secure chat

## 2023-10-13 NOTE — Progress Notes (Signed)
 Mob[facility] Specialist - Progress Note    10/13/23 1709  Therapy Vitals  Temp 99.1 F (37.3 C)  BP (!) 138/101  Patient Position (if appropriate) Sitting  Mobility  Activity Stood at bedside  Level of Assistance Contact guard assist, steadying assist  Assistive Device None  Range of Motion/Exercises Active  Activity Response Tolerated well  Mobility Referral Yes  Mobility visit 1 Mobility  Mobility Specialist Start Time (ACUTE ONLY) 1647  Mobility Specialist Stop Time (ACUTE ONLY) 1700  Mobility Specialist Time Calculation (min) (ACUTE ONLY) 13 min   Pt resting in bed on RA upon entry. Pt moved to EOB MinA. Pt stood beside bed CGA with no AD. HHA with MS. Pt started turning red, dizziness and endorsed feeling pain in head. Pt vitals recorded and pt laid back down.   Guido Rumble Mobility Specialist 10/13/23, 5:26 PM

## 2023-10-13 NOTE — Progress Notes (Signed)
 Received message patient was asking to speak with me. Visited briefly at bedside. Patient communicating much more easily and frequently. Visitors at bedside. 1:1 sitter remains.

## 2023-10-13 NOTE — Plan of Care (Signed)

## 2023-10-14 DIAGNOSIS — G9341 Metabolic encephalopathy: Secondary | ICD-10-CM | POA: Diagnosis not present

## 2023-10-14 MED ORDER — AMLODIPINE BESYLATE 10 MG PO TABS
10.0000 mg | ORAL_TABLET | Freq: Every day | ORAL | Status: DC
  Administered 2023-10-15: 10 mg via ORAL
  Filled 2023-10-14: qty 1

## 2023-10-14 NOTE — Progress Notes (Signed)
 [facility]  - [place] at [facility]   PATIENT NAME: Russell Kennedy    MR#:  [id number]  DATE OF BIRTH:  November 04, 1990  SUBJECTIVE:  sitter at bedside  met with patient's girlfriend at bedside. Patient appears to be in better spirits.  He is requesting to go out for fresh air.  Pt wondering when he can go home VITALS:  Blood pressure (!) 142/103, pulse 97, temperature 97.7 F (36.5 C), temperature source Oral, resp. rate 18, height 6' 5 (1.956 m), weight 117.5 kg, SpO2 98%.  PHYSICAL EXAMINATION:   GENERAL:  33 y.o.-year-old patient with no acute distress.  LUNGS: Normal breath sounds bilaterally CARDIOVASCULAR: S1, S2 normal. No murmur   ABDOMEN: Soft, nontender, nondistended.  EXTREMITIES: No  edema b/l.    NEUROLOGIC: alert and oriented pleasant able to hold conversation  LABORATORY PANEL:  CBC Recent Labs  Lab 10/11/23 0528  WBC 9.5  HGB 12.0*  HCT 36.1*  PLT 279    Chemistries  Recent Labs  Lab 10/11/23 0528 10/12/23 0329  NA 140  --   K 3.2* 3.4*  CL 106  --   CO2 25  --   GLUCOSE 116*  --   BUN 12  --   CREATININE 0.72  --   CALCIUM 8.8*  --   MG 2.2  --     Russell Kennedy is a 33 y.o. male with medical history significant for Polysubstance abuse including opioid use disorder as well as alcohol use disorder who arrived to the ED today and in the day under IVC due to aggressive behavior and hallucinations being admitted with a fever of uncertain etiology.  Patient was tachycardic and hypertensive(SBP 170s to 180s) on arrival and was initially treated for acute withdrawal. Toxicology revealed EtOH less than 15 and methadone on UDS.  Salicylate less than 7   Assessment and Plan:   Acute toxic-metabolic encephalopathy Acute drug withdrawal  Acute alcohol withdrawal with delirium/hallucinations--improving Polysubstance abuse Alcohol use disorder --Patient reportedly stopped all substances approximately some days prior and since then became  agitated and started hallucinating --continue phenobarb taper --pt on IVC, continue sitter 9/13 fever resolved   9/15 Haldol  IV x 1 dose and Geodon  10 mg IM x 1 dose given due to acute agitation --9/18-- sitter at bedside. Patient was a bit agitated with some tremors. RN gave Haldol . Not able to have meaningful conversation today. No family at bedside per RN patient walked around the nurses station.  -- Psych input appreciated. --9/19-- ambulating around the nurses station. Patient was taken outside for walk with staff. Tolerating PO diet. Met with mom. Does have. Of mild confusion. Slow to respond but answers most questions appropriately blood pressure much improved. Medications adjusted. Added lisinopril  and amlodipine  --9/20-- patient's girlfriend at bedside. He appears to be in better spirits. Smiling. Tolerating PO diet. Requesting to go outside for fresh air. MRI brain nothing acute discussed results with girlfriend. --9/21-- continues to show improvement. Mood better. Tolerating diet. Awaiting psych input for discharge disposition from medical floor.    Fever of undetermined origin-- resolved --blood cultures NGTD No identifiable source of infection and low suspicion for meningitis Holding off on antibiotics for now--> procalcitonin<0.1, lactic acid 0.8    Rhabdomyolysis secondary to drug abuse.  CK 869>>851>> 697>> 415>> 255 patient encouraged to drink oral fluids\   Hypokalemia: Potassium repleted Hypophosphatemia due to nutritional deficiency.  Phos repleted D/c tele   Hypertensive urgency No prior diagnosis of hypertension, elevated BP  secondary to withdrawal symptoms and drug abuse.  --Will adjust BP meds --cont present meds  Urinary retention, In-N-Out catheter was done multiple times. 9/16 indwelling Foley catheter inserted 9/18-- per RN patient ambulating to the bathroom and outside of the nurses station. Will DC Foley. Encourage patient to use bathroom/urine --no  issues with urination    Family communication :GF at bedside Consults : psych CODE STATUS: full DVT Prophylaxis : enoxaparin  Level of care: Med-Surg Status is: Inpatient Remains inpatient appropriate because: monitoring BP and overall mentation. Await Psych input regarding possible transfer to BHU    TOTAL TIME TAKING CARE OF THIS PATIENT: 35 minutes.  >50% time spent on counselling and coordination of care  Note: This dictation was prepared with Dragon dictation along with smaller phrase technology. Any transcriptional errors that result from this process are unintentional.  Leita Blanch M.D    [facility]   CC: Primary care physician; Patient, No Pcp Per

## 2023-10-14 NOTE — Plan of Care (Signed)

## 2023-10-15 ENCOUNTER — Other Ambulatory Visit: Payer: Self-pay

## 2023-10-15 ENCOUNTER — Inpatient Hospital Stay
Admission: AD | Admit: 2023-10-15 | Discharge: 2023-10-21 | DRG: 897 | Disposition: A | Discharge status: Home / Self Care | Payer: MEDICAID | Source: Intra-hospital | Attending: Child & Adolescent Psychiatry | Admitting: Child & Adolescent Psychiatry

## 2023-10-15 ENCOUNTER — Encounter: Payer: Self-pay | Admitting: Psychiatry

## 2023-10-15 DIAGNOSIS — F32A Depression, unspecified: Secondary | ICD-10-CM | POA: Diagnosis present

## 2023-10-15 DIAGNOSIS — R000 Tachycardia, unspecified: Secondary | ICD-10-CM | POA: Diagnosis present

## 2023-10-15 DIAGNOSIS — F17210 Nicotine dependence, cigarettes, uncomplicated: Secondary | ICD-10-CM | POA: Diagnosis present

## 2023-10-15 DIAGNOSIS — W19XXXA Unspecified fall, initial encounter: Secondary | ICD-10-CM | POA: Diagnosis present

## 2023-10-15 DIAGNOSIS — F1994 Other psychoactive substance use, unspecified with psychoactive substance-induced mood disorder: Secondary | ICD-10-CM | POA: Diagnosis present

## 2023-10-15 DIAGNOSIS — F1920 Other psychoactive substance dependence, uncomplicated: Secondary | ICD-10-CM

## 2023-10-15 DIAGNOSIS — F1123 Opioid dependence with withdrawal: Principal | ICD-10-CM | POA: Diagnosis present

## 2023-10-15 DIAGNOSIS — F10231 Alcohol dependence with withdrawal delirium: Secondary | ICD-10-CM | POA: Diagnosis present

## 2023-10-15 DIAGNOSIS — G9341 Metabolic encephalopathy: Secondary | ICD-10-CM | POA: Diagnosis not present

## 2023-10-15 DIAGNOSIS — F419 Anxiety disorder, unspecified: Secondary | ICD-10-CM | POA: Diagnosis present

## 2023-10-15 DIAGNOSIS — Z79899 Other long term (current) drug therapy: Secondary | ICD-10-CM | POA: Diagnosis not present

## 2023-10-15 DIAGNOSIS — I10 Essential (primary) hypertension: Secondary | ICD-10-CM | POA: Diagnosis present

## 2023-10-15 DIAGNOSIS — F39 Unspecified mood [affective] disorder: Secondary | ICD-10-CM | POA: Insufficient documentation

## 2023-10-15 DIAGNOSIS — F1011 Alcohol abuse, in remission: Secondary | ICD-10-CM | POA: Insufficient documentation

## 2023-10-15 DIAGNOSIS — R45851 Suicidal ideations: Secondary | ICD-10-CM

## 2023-10-15 DIAGNOSIS — Z8659 Personal history of other mental and behavioral disorders: Secondary | ICD-10-CM | POA: Diagnosis not present

## 2023-10-15 LAB — CBC
HCT: 35.6 % — ABNORMAL LOW (ref 39.0–52.0)
Hemoglobin: 11.6 g/dL — ABNORMAL LOW (ref 13.0–17.0)
MCH: 27.4 pg (ref 26.0–34.0)
MCHC: 32.6 g/dL (ref 30.0–36.0)
MCV: 84 fL (ref 80.0–100.0)
Platelets: 342 K/uL (ref 150–400)
RBC: 4.24 MIL/uL (ref 4.22–5.81)
RDW: 13.5 % (ref 11.5–15.5)
WBC: 10.4 K/uL (ref 4.0–10.5)
nRBC: 0 % (ref 0.0–0.2)

## 2023-10-15 LAB — BASIC METABOLIC PANEL WITH GFR
Anion gap: 14 (ref 5–15)
BUN: 12 mg/dL (ref 6–20)
CO2: 24 mmol/L (ref 22–32)
Calcium: 9 mg/dL (ref 8.9–10.3)
Chloride: 102 mmol/L (ref 98–111)
Creatinine, Ser: 0.65 mg/dL (ref 0.61–1.24)
GFR, Estimated: >60 mL/min (ref >60)
Glucose, Bld: 107 mg/dL — ABNORMAL HIGH (ref 70–99)
Potassium: 3.3 mmol/L — ABNORMAL LOW (ref 3.5–5.1)
Sodium: 140 mmol/L (ref 135–145)

## 2023-10-15 MED ORDER — HALOPERIDOL LACTATE 5 MG/ML IJ SOLN: 5.0000 mg | Freq: Three times a day (TID) | INTRAMUSCULAR | Status: DC | PRN

## 2023-10-15 MED ORDER — LISINOPRIL 10 MG PO TABS: 10.0000 mg | ORAL_TABLET | Freq: Every day | ORAL | Status: DC

## 2023-10-15 MED ORDER — HALOPERIDOL 5 MG PO TABS: 5.0000 mg | ORAL_TABLET | Freq: Three times a day (TID) | ORAL | Status: DC | PRN

## 2023-10-15 MED ORDER — ACETAMINOPHEN 325 MG PO TABS
650.0000 mg | ORAL_TABLET | Freq: Four times a day (QID) | ORAL | Status: DC | PRN
  Administered 2023-10-15 – 2023-10-20 (×7): 650 mg via ORAL
  Filled 2023-10-15 (×8): qty 2

## 2023-10-15 MED ORDER — AMLODIPINE BESYLATE 10 MG PO TABS: 10.0000 mg | ORAL_TABLET | Freq: Every day | ORAL | Status: DC

## 2023-10-15 MED ORDER — NICOTINE 21 MG/24HR TD PT24
21.0000 mg | MEDICATED_PATCH | Freq: Every day | TRANSDERMAL | Status: DC
  Administered 2023-10-15 – 2023-10-20 (×6): 21 mg via TRANSDERMAL
  Filled 2023-10-15 (×6): qty 1

## 2023-10-15 MED ORDER — OLANZAPINE 10 MG IM SOLR
10.0000 mg | Freq: Three times a day (TID) | INTRAMUSCULAR | Status: DC | PRN
  Administered 2023-10-15: 10 mg via INTRAMUSCULAR
  Filled 2023-10-15: qty 10

## 2023-10-15 MED ORDER — CLONIDINE HCL 0.2 MG/24HR TD PTWK: 0.2000 mg | MEDICATED_PATCH | TRANSDERMAL | Status: DC

## 2023-10-15 MED ORDER — ACETAMINOPHEN 325 MG PO TABS: 650.0000 mg | ORAL_TABLET | Freq: Four times a day (QID) | ORAL | Status: DC | PRN

## 2023-10-15 MED ORDER — MAGNESIUM HYDROXIDE 400 MG/5ML PO SUSP: 30.0000 mL | Freq: Every day | ORAL | Status: DC | PRN

## 2023-10-15 MED ORDER — HALOPERIDOL LACTATE 5 MG/ML IJ SOLN: 10.0000 mg | Freq: Three times a day (TID) | INTRAMUSCULAR | Status: DC | PRN

## 2023-10-15 MED ORDER — LORAZEPAM 2 MG/ML IJ SOLN
2.0000 mg | Freq: Three times a day (TID) | INTRAMUSCULAR | Status: DC | PRN
  Filled 2023-10-15: qty 1

## 2023-10-15 MED ORDER — CLONIDINE 0.2 MG/24HR TD PTWK: 0.2000 mg | MEDICATED_PATCH | TRANSDERMAL | Status: DC

## 2023-10-15 MED ORDER — DIPHENHYDRAMINE HCL 50 MG/ML IJ SOLN: 50.0000 mg | Freq: Three times a day (TID) | INTRAMUSCULAR | Status: DC | PRN

## 2023-10-15 MED ORDER — OLANZAPINE 5 MG PO TBDP
5.0000 mg | ORAL_TABLET | Freq: Three times a day (TID) | ORAL | Status: DC | PRN
  Administered 2023-10-15 – 2023-10-16 (×2): 5 mg via ORAL
  Filled 2023-10-15 (×2): qty 1

## 2023-10-15 MED ORDER — ALUM & MAG HYDROXIDE-SIMETH 200-200-20 MG/5ML PO SUSP: 30.0000 mL | ORAL | Status: DC | PRN

## 2023-10-15 MED ORDER — OLANZAPINE 10 MG IM SOLR: 5.0000 mg | Freq: Three times a day (TID) | INTRAMUSCULAR | Status: DC | PRN

## 2023-10-15 MED ORDER — LORAZEPAM 2 MG/ML IJ SOLN
2.0000 mg | Freq: Three times a day (TID) | INTRAMUSCULAR | Status: DC | PRN
  Administered 2023-10-17: 2 mg via INTRAMUSCULAR

## 2023-10-15 MED ORDER — TRAZODONE HCL 50 MG PO TABS
50.0000 mg | ORAL_TABLET | Freq: Every evening | ORAL | Status: DC | PRN
  Administered 2023-10-15 – 2023-10-17 (×3): 50 mg via ORAL
  Filled 2023-10-15 (×3): qty 1

## 2023-10-15 MED ORDER — LABETALOL HCL 200 MG PO TABS
300.0000 mg | ORAL_TABLET | Freq: Three times a day (TID) | ORAL | Status: DC
  Administered 2023-10-15 – 2023-10-19 (×13): 300 mg via ORAL
  Filled 2023-10-15 (×14): qty 1

## 2023-10-15 MED ORDER — LABETALOL HCL 300 MG PO TABS: 300.0000 mg | ORAL_TABLET | Freq: Three times a day (TID) | ORAL | Status: DC

## 2023-10-15 MED ORDER — OLANZAPINE 5 MG PO TABS
5.0000 mg | ORAL_TABLET | Freq: Every day | ORAL | Status: DC
  Administered 2023-10-15: 5 mg via ORAL
  Filled 2023-10-15: qty 1

## 2023-10-15 MED ORDER — POTASSIUM CHLORIDE CRYS ER 20 MEQ PO TBCR
40.0000 meq | EXTENDED_RELEASE_TABLET | Freq: Two times a day (BID) | ORAL | Status: DC
  Administered 2023-10-15: 40 meq via ORAL
  Filled 2023-10-15: qty 2

## 2023-10-15 MED ORDER — LISINOPRIL 5 MG PO TABS
10.0000 mg | ORAL_TABLET | Freq: Every day | ORAL | Status: DC
  Administered 2023-10-16 – 2023-10-19 (×4): 10 mg via ORAL
  Filled 2023-10-15 (×4): qty 2

## 2023-10-15 MED ORDER — OLANZAPINE 10 MG IM SOLR: 10.0000 mg | Freq: Three times a day (TID) | INTRAMUSCULAR | Status: DC | PRN

## 2023-10-15 MED ORDER — AMLODIPINE BESYLATE 5 MG PO TABS
10.0000 mg | ORAL_TABLET | Freq: Every day | ORAL | Status: DC
  Administered 2023-10-16 – 2023-10-18 (×3): 10 mg via ORAL
  Filled 2023-10-15 (×3): qty 2

## 2023-10-15 MED ORDER — OLANZAPINE 5 MG PO TABS: 5.0000 mg | ORAL_TABLET | Freq: Every day | ORAL | Status: DC

## 2023-10-15 MED ORDER — DIPHENHYDRAMINE HCL 25 MG PO CAPS
50.0000 mg | ORAL_CAPSULE | Freq: Three times a day (TID) | ORAL | Status: DC | PRN
  Administered 2023-10-16 – 2023-10-18 (×3): 50 mg via ORAL
  Filled 2023-10-15 (×3): qty 2
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
## 2023-10-15 NOTE — Progress Notes (Signed)
 33 year old male patient admitted from medical floor for substance induced mood disorder. Patient appears tired and sleepy. Patient cooperative with admission assessment. Patient states his main stress is  hard to get comfortable. Patient stated that his goal is to get in to a rehab program. Patient denies SI,HI and AVH. Skin assessment and body search done. No contraband found. Oriented to unit and made comfortable. Support and encouragement given.

## 2023-10-15 NOTE — Discharge Summary (Signed)
 Physician Discharge Summary  Patient: Russell Kennedy FMW:[id number] DOB: 03-07-1990   Code Status: Full Code Admit date: 10/05/2023 Discharge date: 10/15/2023 Disposition: behavioral health admission for SI PCP: Patient, No Pcp Per  Recommendations for Outpatient Follow-up:  Follow up with PCP within 1-2 weeks Regarding general hospital follow up and preventative care Recommend monitoring blood pressure and adjust medications as needed Follow up with psychiatry upon [facility] admission Follow up with substance use program  Discharge Diagnoses:  Principal Problem:   Acute toxic-metabolic encephalopathy Active Problems:   Fever of undetermined origin   Acute drug withdrawal syndrome with complication (HCC)   Polysubstance abuse (HCC)   Alcohol use disorder   Elevated blood pressure reading without diagnosis of hypertension   Hypokalemia  Brief Hospital Course Summary: Russell Kennedy is a 33 y.o. male with a PMH significant for Polysubstance use including opioid use disorder and alcohol use disorder. They presented to the ED with aggressive behavior and hallucinations and placed under IVC for concern of self-harm.  Additionally, found to have fever of unknown origin. Vitals indicated patient was tachycardic and hypertensive (SBP 170s to 180s) on arrival possibly related to various intoxications and withdrawals.  Toxicology revealed EtOH less than 15 and methadone on UDS.  Salicylate less than 7. initially treated for acute withdrawal. Psychiatry managed medications as listed below and maintained IVC. Will admit to [facility] today since he is medically cleared and will be followed by substance use rehab, per report.   Hypertension and tachycardia treated as listed below and patient is tolerating treatment well without unintentional symptoms.   All other chronic conditions were treated with home medications.    Discharge Condition: Stable, improved Recommended discharge diet: Regular healthy  diet  Consultations: Psychiatry   Procedures/Studies: None   Allergies as of 10/15/2023   No Known Allergies      Medication List     TAKE these medications    acetaminophen  325 MG tablet Commonly known as: TYLENOL  Take 2 tablets (650 mg total) by mouth every 6 (six) hours as needed for mild pain (pain score 1-3) or fever (or Fever >/= 101).   amLODipine  10 MG tablet Commonly known as: NORVASC  Take 1 tablet (10 mg total) by mouth daily. Start taking on: October 16, 2023   cloNIDine  0.2 mg/24hr patch Commonly known as: CATAPRES  - Dosed in mg/24 hr Place 1 patch (0.2 mg total) onto the skin once a week. Start taking on: October 22, 2023   labetalol  300 MG tablet Commonly known as: NORMODYNE  Take 1 tablet (300 mg total) by mouth 3 (three) times daily.   lisinopril  10 MG tablet Commonly known as: ZESTRIL  Take 1 tablet (10 mg total) by mouth daily. Start taking on: October 16, 2023   OLANZapine  5 MG tablet Commonly known as: ZYPREXA  Take 1 tablet (5 mg total) by mouth at bedtime.       Subjective   Pt reports feeling well. Denies CP, SOB. Has no medical complaints at this time.  Sitter at bedside.   All questions and concerns were addressed at time of discharge.  Objective  Blood pressure (!) 147/110, pulse 88, temperature 98 F (36.7 C), resp. rate 18, height 6' 5 (1.956 m), weight 117.5 kg, SpO2 100%.   General: Pt is alert, awake, not in acute distress Cardiovascular: RRR, S1/S2 +, no rubs, no gallops Respiratory: CTA bilaterally, no wheezing, no rhonchi Abdominal: Soft, NT, ND, bowel sounds + Extremities: no edema, no cyanosis  The results of significant diagnostics from  this hospitalization (including imaging, microbiology, ancillary and laboratory) are listed below for reference.   Imaging studies: MR BRAIN WO CONTRAST Result Date: 10/13/2023 CLINICAL DATA:  33 year old male altered mental status. Headache, fever, agitated, leukocytosis. EXAM:  MRI HEAD WITHOUT CONTRAST TECHNIQUE: Multiplanar, multiecho pulse sequences of the brain and surrounding structures were obtained without intravenous contrast. COMPARISON:  Head CT 10/05/2023 and earlier. FINDINGS: Brain: Cerebral volume is within normal limits. No restricted diffusion to suggest acute infarction. No midline shift, mass effect, evidence of mass lesion, ventriculomegaly, extra-axial collection or acute intracranial hemorrhage. Cervicomedullary junction and pituitary are within normal limits. Elnor and white matter signal is within normal limits throughout the brain. No encephalomalacia or edema identified. No chronic cerebral blood products on T2 *. Vascular: Major intracranial vascular flow voids are preserved. Distal left vertebral artery appears mildly dominant. Skull and upper cervical spine: Negative. Visualized bone marrow signal is within normal limits. Sinuses/Orbits: Left maxillary sinus opacification appears related to chronic retention cyst with involvement of the left OMC. Mild left ethmoid sinus mucosal thickening. Small contralateral right maxillary sinus retention cyst. Other paranasal sinuses and mastoids are clear. Other: Visible internal auditory structures appear normal. Negative visible face. There is a small area of right posterior scalp convexity skin and subcutaneous heterogeneity (series 12, image 24) corresponding to finding described on recent head CT. This is nonspecific but might be chronic scarring. IMPRESSION: 1. Normal noncontrast MRI appearance of the Brain. 2. Chronic left maxillary sinus retention cyst(s), progressed since 2016. Electronically Signed   By: VEAR Hurst M.D.   On: 10/13/2023 08:20   DG Chest Portable 1 View Result Date: 10/05/2023 CLINICAL DATA:  Fever EXAM: PORTABLE CHEST 1 VIEW COMPARISON:  Chest x-ray 10/05/2023 FINDINGS: The heart size and mediastinal contours are within normal limits. Both lungs are clear. The visualized skeletal structures are  unremarkable. IMPRESSION: No active disease. Electronically Signed   By: Greig Pique M.D.   On: 10/05/2023 21:03   CT Elbow Right Wo Contrast Result Date: 10/05/2023 CLINICAL DATA:  Elbow laceration after fall. EXAM: CT OF THE UPPER RIGHT EXTREMITY WITHOUT CONTRAST TECHNIQUE: Multidetector CT imaging of the upper right extremity was performed according to the standard protocol. RADIATION DOSE REDUCTION: This exam was performed according to the departmental dose-optimization program which includes automated exposure control, adjustment of the mA and/or kV according to patient size and/or use of iterative reconstruction technique. COMPARISON:  None Available. FINDINGS: Bones/Joint/Cartilage No fracture or dislocation. Normal alignment. No sizable joint effusion. Ligaments Ligaments are suboptimally evaluated by CT. Muscles and Tendons Muscles are normal. No muscle atrophy. No intramuscular fluid collection or hematoma. Soft tissue Cutaneous irregularity along the posterior elbow likely corresponds to site of reported laceration with associated subcutaneous edema overlying the olecranon. No radiopaque foreign body. No loculated fluid collection. IMPRESSION: 1. Cutaneous irregularity along the posterior elbow likely corresponds to site of reported laceration with associated subcutaneous edema overlying the olecranon. 2. No acute osseous abnormality. Electronically Signed   By: Harrietta Sherry M.D.   On: 10/05/2023 09:37   CT Cervical Spine Wo Contrast Result Date: 10/05/2023 EXAM: CT CERVICAL SPINE WITHOUT CONTRAST 10/05/2023 08:02:28 AM TECHNIQUE: CT of the cervical spine was performed without the administration of intravenous contrast. Multiplanar reformatted images are provided for review. Automated exposure control, iterative reconstruction, and/or weight based adjustment of the mA/kV was utilized to reduce the radiation dose to as low as reasonably achievable. COMPARISON: Earlier same day CT cervical  spine. CLINICAL HISTORY: Bone lesion, cervical spine,  incidental. Patient fall with laceration to elbow. Repeat performed to reduce motion. FINDINGS: CERVICAL SPINE: BONES AND ALIGNMENT: Straightening and slight reversal of the normal cervical lordosis. No listhesis. No facet subluxation or dislocation. DEGENERATIVE CHANGES: No significant degenerative changes. SOFT TISSUES: No prevertebral soft tissue swelling. IMPRESSION: 1. No acute abnormality of the cervical spine related to the fall. Electronically signed by: Donnice Mania MD 10/05/2023 08:42 AM EDT RP Workstation: [id number]   DG Chest Portable 1 View Result Date: 10/05/2023 CLINICAL DATA:  33 year old male with altered mental status, agitated, leukocytosis. EXAM: PORTABLE CHEST 1 VIEW COMPARISON:  Portable chest 10/02/2023 and earlier. FINDINGS: Portable AP view at 0834 hours. Low but mildly improved lung volumes. Normal cardiac size and mediastinal contours. Visualized tracheal air column is within normal limits. Allowing for portable technique the lungs are clear. Negative visible bowel gas and osseous structures. IMPRESSION: Negative portable chest. Electronically Signed   By: VEAR Hurst M.D.   On: 10/05/2023 08:41   CT HEAD WO CONTRAST ( ) Result Date: 10/05/2023 EXAM: CT HEAD WITHOUT CONTRAST 10/05/2023 08:02:28 AM TECHNIQUE: CT of the head was performed without the administration of intravenous contrast. Automated exposure control, iterative reconstruction, and/or weight based adjustment of the mA/kV was utilized to reduce the radiation dose to as low as reasonably achievable. COMPARISON: 10/05/2023 CLINICAL HISTORY: Headache, fever. Patient fall with laceration to elbow. FINDINGS: BRAIN AND VENTRICLES: No acute hemorrhage. No evidence of acute infarct. No hydrocephalus. No extra-axial collection. No mass effect or midline shift. ORBITS: No acute abnormality. SINUSES: Mucous retention cyst in the right maxillary sinus. Additional mucosal thickening  and mucous retention cyst resulting in near complete opacification of the left maxillary sinus. Similar defect to the anterior nasal septum. SOFT TISSUES AND SKULL: Mild asymmetric soft tissue thickening in the right temporoparietal scalp is similar to multiple prior studies. Recommend correlation with physical exam findings. No skull fracture. IMPRESSION: 1. No acute intracranial abnormality. 2. Mild asymmetric soft tissue thickening in the right temporoparietal scalp, similar to recent prior studies. Recommend correlation with physical exam findings. Electronically signed by: Donnice Mania MD 10/05/2023 08:21 AM EDT RP Workstation: [id number]   CT Cervical Spine Wo Contrast Result Date: 10/05/2023 CLINICAL DATA:  33 year old male with altered mental status, agitated,. Withdrawing from fentanyl  and xanax. EXAM: CT CERVICAL SPINE WITHOUT CONTRAST TECHNIQUE: Multidetector CT imaging of the cervical spine was performed without intravenous contrast. Multiplanar CT image reconstructions were also generated. RADIATION DOSE REDUCTION: This exam was performed according to the departmental dose-optimization program which includes automated exposure control, adjustment of the mA and/or kV according to patient size and/or use of iterative reconstruction technique. COMPARISON:  Head CT today.  Cervical spine CT 10/02/2023. FINDINGS: Alignment: Mild reversal of cervical lordosis is new. Cervicothoracic junction alignment is within normal limits. Bilateral posterior element alignment is within normal limits. Skull base and vertebrae: Bone mineralization is within normal limits. Visualized skull base is intact. No atlanto-occipital dissociation. C1 and C2 appear intact and aligned. No acute osseous abnormality identified. Soft tissues and spinal canal: No prevertebral fluid or swelling. No visible canal hematoma. Negative visible noncontrast neck soft tissues. Disc levels: Intermittent cervical mostly anterior endplate  spurring. No CT evidence of cervical spinal stenosis. Upper chest: Negative visible noncontrast thoracic inlet. Other: Chronic left maxillary sinus retention cyst. IMPRESSION: No acute traumatic injury identified in the cervical spine. Electronically Signed   By: VEAR Hurst M.D.   On: 10/05/2023 05:08   CT HEAD WO CONTRAST ( ) Result Date: 10/05/2023 CLINICAL DATA:  33 year old male with altered mental status, agitated,. Withdrawing from fentanyl  and xanax. EXAM: CT HEAD WITHOUT CONTRAST TECHNIQUE: Contiguous axial images were obtained from the base of the skull through the vertex without intravenous contrast. RADIATION DOSE REDUCTION: This exam was performed according to the departmental dose-optimization program which includes automated exposure control, adjustment of the mA and/or kV according to patient size and/or use of iterative reconstruction technique. COMPARISON:  Head CT 10/02/2023. FINDINGS: Brain: Cerebral volume remains normal. No midline shift, ventriculomegaly, mass effect, evidence of mass lesion, intracranial hemorrhage or evidence of cortically based acute infarction. Gray-white matter differentiation is within normal limits throughout the brain. Vascular: No suspicious intracranial vascular hyperdensity. Skull: No acute osseous abnormality identified. Sinuses/Orbits: Left maxillary sinus retention cyst redemonstrated. Other Visualized paranasal sinuses and mastoids are clear. Other: Visualized orbits and scalp soft tissues are within normal limits. IMPRESSION: Stable and normal noncontrast CT appearance of the brain. Electronically Signed   By: VEAR Hurst M.D.   On: 10/05/2023 05:06   CT Cervical Spine Wo Contrast Result Date: 10/02/2023 CLINICAL DATA:  [name], hit head, loss of consciousness EXAM: CT CERVICAL SPINE WITHOUT CONTRAST TECHNIQUE: Multidetector CT imaging of the cervical spine was performed without intravenous contrast. Multiplanar CT image reconstructions were also generated.  RADIATION DOSE REDUCTION: This exam was performed according to the departmental dose-optimization program which includes automated exposure control, adjustment of the mA and/or kV according to patient size and/or use of iterative reconstruction technique. COMPARISON:  09/03/2017 FINDINGS: Alignment: Alignment is grossly anatomic. Skull base and vertebrae: No acute fracture. No primary bone lesion or focal pathologic process. Soft tissues and spinal canal: No prevertebral fluid or swelling. No visible canal hematoma. Disc levels:  Mild multilevel spondylosis from C3-4 through C5-6. Upper chest: Airway is patent. Visualized portions of the lung apices are clear. Other: Reconstructed images demonstrate no additional findings. IMPRESSION: 1. No acute cervical spine fracture. Electronically Signed   By: Ozell Daring M.D.   On: 10/02/2023 16:47   CT Head Wo Contrast Result Date: 10/02/2023 CLINICAL DATA:  Witnessed fall, hit head, loss of consciousness EXAM: CT HEAD WITHOUT CONTRAST TECHNIQUE: Contiguous axial images were obtained from the base of the skull through the vertex without intravenous contrast. RADIATION DOSE REDUCTION: This exam was performed according to the departmental dose-optimization program which includes automated exposure control, adjustment of the mA and/or kV according to patient size and/or use of iterative reconstruction technique. COMPARISON:  02/19/2014 FINDINGS: Brain: No acute infarct or hemorrhage. Lateral ventricles and midline structures are unremarkable. No acute extra-axial fluid collections. No mass effect. Vascular: No hyperdense vessel or unexpected calcification. Skull: Normal. Negative for fracture or focal lesion. Sinuses/Orbits: Opacification of the left maxillary sinus. Polypoid mucosal thickening right maxillary sinus. Other: None. IMPRESSION: 1. No acute intracranial process. Electronically Signed   By: Ozell Daring M.D.   On: 10/02/2023 16:45   DG Chest Portable 1  View Result Date: 10/02/2023 CLINICAL DATA:  fall/trauma. EXAM: PORTABLE CHEST 1 VIEW COMPARISON:  09/04/2017. FINDINGS: Low lung volume. Bilateral lung fields are clear. Bilateral costophrenic angles are clear. Note is made of elevated right hemidiaphragm. Normal cardio-mediastinal silhouette. No acute osseous abnormalities. The soft tissues are within normal limits. IMPRESSION: No active disease. Electronically Signed   By: Ree Molt M.D.   On: 10/02/2023 15:27    Labs: Basic Metabolic Panel: Recent Labs  Lab 10/09/23 0335 10/10/23 0547 10/11/23 0528 10/12/23 0329 10/15/23 0751  NA 139 140 140  --  140  K 3.3*  3.1* 3.2* 3.4* 3.3*  CL 105 102 106  --  102  CO2 23 22 25   --  24  GLUCOSE 114* 159* 116*  --  107*  BUN 14 15 12   --  12  CREATININE 0.59* 0.62 0.72  --  0.65  CALCIUM 8.5* 8.9 8.8*  --  9.0  MG 2.2 2.2 2.2  --   --   PHOS 2.2* 3.2 3.9 3.9  --    CBC: Recent Labs  Lab 10/09/23 0335 10/10/23 0547 10/11/23 0528 10/15/23 0751  WBC 12.2* 10.6* 9.5 10.4  HGB 13.0 12.4* 12.0* 11.6*  HCT 38.9* 38.4* 36.1* 35.6*  MCV 82.8 84.4 83.4 84.0  PLT 286 246 279 342   Microbiology: Results for orders placed or performed during the hospital encounter of 10/05/23  Resp panel by RT-PCR (RSV, Flu A&B, Covid) Anterior Nasal Swab     Status: None   Collection Time: 10/05/23  8:25 AM   Specimen: Anterior Nasal Swab  Result Value Ref Range Status   SARS Coronavirus 2 by RT PCR NEGATIVE NEGATIVE Final    Comment: (NOTE) SARS-CoV-2 target nucleic acids are NOT DETECTED.  The SARS-CoV-2 RNA is generally detectable in upper respiratory specimens during the acute phase of infection. The lowest concentration of SARS-CoV-2 viral copies this assay can detect is 138 copies/mL. A negative result does not preclude SARS-Cov-2 infection and should not be used as the sole basis for treatment or other patient management decisions. A negative result may occur with  improper specimen  collection/handling, submission of specimen other than nasopharyngeal swab, presence of viral mutation(s) within the areas targeted by this assay, and inadequate number of viral copies(<138 copies/mL). A negative result must be combined with clinical observations, patient history, and epidemiological information. The expected result is Negative.  Fact Sheet for Patients:  BloggerCourse.com  Fact Sheet for Healthcare Providers:  SeriousBroker.it  This test is no t yet approved or cleared by the [place]  FDA and  has been authorized for detection and/or diagnosis of SARS-CoV-2 by FDA under an Emergency Use Authorization (EUA). This EUA will remain  in effect (meaning this test can be used) for the duration of the COVID-19 declaration under Section 564(b)(1) of the Act, 21 U.S.C.section 360bbb-3(b)(1), unless the authorization is terminated  or revoked sooner.       Influenza A by PCR NEGATIVE NEGATIVE Final   Influenza B by PCR NEGATIVE NEGATIVE Final    Comment: (NOTE) The Xpert Xpress SARS-CoV-2/FLU/RSV plus assay is intended as an aid in the diagnosis of influenza from Nasopharyngeal swab specimens and should not be used as a sole basis for treatment. Nasal washings and aspirates are unacceptable for Xpert Xpress SARS-CoV-2/FLU/RSV testing.  Fact Sheet for Patients: BloggerCourse.com  Fact Sheet for Healthcare Providers: SeriousBroker.it  This test is not yet approved or cleared by the [place]  FDA and has been authorized for detection and/or diagnosis of SARS-CoV-2 by FDA under an Emergency Use Authorization (EUA). This EUA will remain in effect (meaning this test can be used) for the duration of the COVID-19 declaration under Section 564(b)(1) of the Act, 21 U.S.C. section 360bbb-3(b)(1), unless the authorization is terminated or revoked.     Resp Syncytial  Virus by PCR NEGATIVE NEGATIVE Final    Comment: (NOTE) Fact Sheet for Patients: BloggerCourse.com  Fact Sheet for Healthcare Providers: SeriousBroker.it  This test is not yet approved or cleared by the [place]  FDA and has been authorized for detection and/or diagnosis of SARS-CoV-2  by FDA under an Emergency Use Authorization (EUA). This EUA will remain in effect (meaning this test can be used) for the duration of the COVID-19 declaration under Section 564(b)(1) of the Act, 21 U.S.C. section 360bbb-3(b)(1), unless the authorization is terminated or revoked.  Performed at [facility], [address] [place], [place] [address]   Blood culture (single)     Status: None   Collection Time: 10/05/23  8:25 AM   Specimen: BLOOD  Result Value Ref Range Status   Specimen Description BLOOD BLOOD LEFT ARM  Final   Special Requests   Final    BOTTLES DRAWN AEROBIC AND ANAEROBIC Blood Culture results may not be optimal due to an inadequate volume of blood received in culture bottles   Culture   Final    NO GROWTH 5 DAYS Performed at [facility], [address] [place], [place] [address]    Report Status 10/10/2023 FINAL  Final  Culture, blood (Routine X 2) w Reflex to ID Panel     Status: None   Collection Time: 10/06/23 12:29 AM   Specimen: Right Antecubital; Blood  Result Value Ref Range Status   Specimen Description RIGHT ANTECUBITAL  Final   Special Requests   Final    BOTTLES DRAWN AEROBIC AND ANAEROBIC Blood Culture results may not be optimal due to an inadequate volume of blood received in culture bottles   Culture   Final    NO GROWTH 5 DAYS Performed at [facility], [address] [place], [place] [address]    Report Status 10/11/2023 FINAL  Final  Culture, blood (Routine X 2) w Reflex to ID Panel     Status: None   Collection Time: 10/06/23 12:29 AM   Specimen: Left Antecubital; Blood   Result Value Ref Range Status   Specimen Description LEFT ANTECUBITAL  Final   Special Requests   Final    BOTTLES DRAWN AEROBIC AND ANAEROBIC Blood Culture adequate volume   Culture   Final    NO GROWTH 5 DAYS Performed at [facility], [address] [place], [place] [address]    Report Status 10/11/2023 FINAL  Final    Time coordinating discharge: Over 30 minutes  Marien LITTIE Piety, MD  [facility] 10/15/2023, 10:09 AM

## 2023-10-15 NOTE — Group Note (Signed)
 LCSW Group Therapy Note  Group Date: 10/15/2023 Start Time: 1300 End Time: 1400   Type of Therapy and Topic:  Group Therapy - How To Cope with Nervousness about Discharge   Participation Level:  Did Not Attend   Description of Group This process group involved identification of patients' feelings about discharge. Some of them are scheduled to be discharged soon, while others are new admissions, but each of them was asked to share thoughts and feelings surrounding discharge from the hospital. One common theme was that they are excited at the prospect of going home, while another was that many of them are apprehensive about sharing why they were hospitalized. Patients were given the opportunity to discuss these feelings with their peers in preparation for discharge.  Therapeutic Goals  Patient will identify their overall feelings about pending discharge. Patient will think about how they might proactively address issues that they believe will once again arise once they get home (i.e. with parents). Patients will participate in discussion about having hope for change.   Summary of Patient Progress:  X  Therapeutic Modalities Cognitive Behavioral Therapy   Sherryle JINNY Margo, LCSW 10/15/2023  3:12 PM

## 2023-10-15 NOTE — Group Note (Signed)
 Recreation Therapy Group Note   Group Topic:Other  Group Date: 10/15/2023 Start Time: 1455 End Time: 1540 Facilitators: Celestia Jeoffrey FORBES ARTICE, CTRS Location: Craft [facility]  Activity Description/Intervention: Therapeutic Drumming. Patients with peers and staff were given the opportunity to engage in a leader facilitated [facility] Group Empowerment Drumming Circle with staff from the [employer], in partnership with [employer]. Teaching laboratory technician and trained [employer], Norleen Mon leading with LRT observing and documenting intervention and pt response. This evidenced-based practice targets 7 areas of health and wellbeing in the human experience including: stress-reduction, exercise, self-expression, camaraderie/support, nurturing, spirituality, and music-making (leisure).    Goal Area(s) Addresses:  Patient will engage in pro-social way in music group.  Patient will follow directions of drum leader on the first prompt. Patient will demonstrate no behavioral issues during group.  Patient will identify if a reduction in stress level occurs as a result of participation in therapeutic drum circle.     Affect/Mood: Flat   Participation Level: Minimal    Clinical Observations/Individualized Feedback: Delmus was present in group. Pt was observed sitting in the drum circle and staring off, pt was minimally active.   Plan: Continue to engage patient in RT group sessions 2-3x/week.   Jeoffrey FORBES Celestia, LRT, CTRS 10/15/2023 4:47 PM

## 2023-10-15 NOTE — Group Note (Signed)
 Date:  10/15/2023 Time:  3:28 PM  Group Topic/Focus:  Goals Group:   The focus of this group is to help patients establish daily goals to achieve during treatment and discuss how the patient can incorporate goal setting into their daily lives to aide in recovery.    Participation Level:  Active  Participation Quality:  Appropriate  Affect:  Appropriate  Cognitive:  Appropriate  Insight: Appropriate  Engagement in Group:  Engaged  Modes of Intervention:  Discussion  Additional Comments:    Russell Kennedy 10/15/2023, 3:28 PM

## 2023-10-15 NOTE — Plan of Care (Signed)
   Problem: Education: Goal: Emotional status will improve Outcome: Progressing   Problem: Activity: Goal: Sleeping patterns will improve Outcome: Progressing

## 2023-10-15 NOTE — Group Note (Signed)
 Date:  10/15/2023 Time:  8:51 PM  Group Topic/Focus:  Wrap-Up Group:   The focus of this group is to help patients review their daily goal of treatment and discuss progress on daily workbooks.    Participation Level:  Did Not Attend  Participation Quality:  Did not attend  Affect:  Did not attend  Cognitive:  Did not attend  Insight: None  Engagement in Group:  None  Modes of Intervention:  did not attend  Additional Comments:  Did not attend but asked for Patient self inventory sheet at the end.  Leigh VEAR Pais 10/15/2023, 8:51 PM

## 2023-10-15 NOTE — Progress Notes (Signed)
   10/15/23 2048  Psych Admission Type (Psych Patients Only)  Admission Status Involuntary  Psychosocial Assessment  Patient Complaints Anxiety  Eye Contact Fair  Facial Expression Anxious  Affect Anxious  Speech Logical/coherent  Interaction Assertive  Motor Activity Slow  Appearance/Hygiene Unremarkable  Behavior Characteristics Cooperative  Mood Anxious  Thought Process  Coherency WDL  Content WDL  Delusions None reported or observed  Perception WDL  Hallucination None reported or observed  Judgment WDL  Confusion WDL  Danger to Self  Current suicidal ideation? Denies  Danger to Others  Danger to Others None reported or observed

## 2023-10-15 NOTE — Tx Team (Signed)
 Initial Treatment Plan 10/15/2023 4:06 PM [name] FMW:[id number]    PATIENT STRESSORS: Health problems   Substance abuse     PATIENT STRENGTHS: Communication skills  Physical Health  Supportive family/friends  Work skills    PATIENT IDENTIFIED PROBLEMS: Polysubstance abuse  Depression  Anxiety  Unemployment                DISCHARGE CRITERIA:  Ability to meet basic life and health needs Adequate post-discharge living arrangements Medical problems require only outpatient monitoring Motivation to continue treatment in a less acute level of care Verbal commitment to aftercare and medication compliance  PRELIMINARY DISCHARGE PLAN: Attend aftercare/continuing care group Attend 12-step recovery group Return to previous living arrangement  PATIENT/FAMILY INVOLVEMENT: This treatment plan has been presented to and reviewed with the patient, Russell Kennedy, and/or family member,  The patient and family have been given the opportunity to ask questions and make suggestions.  Dollie Zachary Sandifer, RN 10/15/2023, 4:06 PM

## 2023-10-15 NOTE — Progress Notes (Signed)
 Patient becoming increasingly agitated, calling out to Help him. Opening and slamming door repeatedly. Prn given for agitation.

## 2023-10-16 ENCOUNTER — Encounter: Payer: Self-pay | Admitting: Psychiatry

## 2023-10-16 DIAGNOSIS — F1994 Other psychoactive substance use, unspecified with psychoactive substance-induced mood disorder: Secondary | ICD-10-CM | POA: Diagnosis not present

## 2023-10-16 LAB — COMPREHENSIVE METABOLIC PANEL WITH GFR
ALT: 39 U/L (ref 0–44)
AST: 22 U/L (ref 15–41)
Albumin: 3.8 g/dL (ref 3.5–5.0)
Alkaline Phosphatase: 53 U/L (ref 38–126)
Anion gap: 13 (ref 5–15)
BUN: 11 mg/dL (ref 6–20)
CO2: 25 mmol/L (ref 22–32)
Calcium: 9.5 mg/dL (ref 8.9–10.3)
Chloride: 102 mmol/L (ref 98–111)
Creatinine, Ser: 0.75 mg/dL (ref 0.61–1.24)
GFR, Estimated: >60 mL/min (ref >60)
Glucose, Bld: 111 mg/dL — ABNORMAL HIGH (ref 70–99)
Potassium: 3.6 mmol/L (ref 3.5–5.1)
Sodium: 140 mmol/L (ref 135–145)
Total Bilirubin: 0.4 mg/dL (ref 0.0–1.2)
Total Protein: 7.3 g/dL (ref 6.5–8.1)

## 2023-10-16 LAB — CBC
HCT: 39.1 % (ref 39.0–52.0)
Hemoglobin: 12.7 g/dL — ABNORMAL LOW (ref 13.0–17.0)
MCH: 27.5 pg (ref 26.0–34.0)
MCHC: 32.5 g/dL (ref 30.0–36.0)
MCV: 84.6 fL (ref 80.0–100.0)
Platelets: 356 K/uL (ref 150–400)
RBC: 4.62 MIL/uL (ref 4.22–5.81)
RDW: 13.7 % (ref 11.5–15.5)
WBC: 11.4 K/uL — ABNORMAL HIGH (ref 4.0–10.5)
nRBC: 0 % (ref 0.0–0.2)

## 2023-10-16 MED ORDER — OLANZAPINE 10 MG PO TABS
10.0000 mg | ORAL_TABLET | Freq: Every day | ORAL | Status: DC
  Administered 2023-10-16 – 2023-10-20 (×5): 10 mg via ORAL
  Filled 2023-10-16 (×5): qty 1

## 2023-10-16 MED ORDER — FLUOXETINE HCL 10 MG PO CAPS
10.0000 mg | ORAL_CAPSULE | Freq: Every day | ORAL | Status: DC
  Administered 2023-10-16 – 2023-10-21 (×6): 10 mg via ORAL
  Filled 2023-10-16 (×6): qty 1

## 2023-10-16 MED ORDER — HYDROXYZINE HCL 50 MG PO TABS
50.0000 mg | ORAL_TABLET | Freq: Three times a day (TID) | ORAL | Status: DC | PRN
  Administered 2023-10-16 – 2023-10-20 (×10): 50 mg via ORAL
  Filled 2023-10-16 (×10): qty 1

## 2023-10-16 MED ORDER — LIDOCAINE 5 % EX PTCH
1.0000 | MEDICATED_PATCH | Freq: Once | CUTANEOUS | Status: AC
  Administered 2023-10-16: 1 via TRANSDERMAL
  Filled 2023-10-16: qty 1

## 2023-10-16 NOTE — H&P (Signed)
 Psychiatric Admission Assessment Adult  Patient Identification: Russell Kennedy MRN:  [id number] Date of Evaluation:  10/16/2023 Chief Complaint:  Substance induced mood disorder (HCC) [F19.94]   History of Present Illness:  The patient is a 33 year old male with a significant history of polysubstance use disorder who presented to the emergency department under IVC initiated by his father due to escalating behavioral concerns and suspected intoxication/withdrawal. Per collateral history and IVC paperwork, patient has a longstanding history of fentanyl  and alprazolam abuse, is enrolled in methadone clinic treatment, and has required multiple past rehabilitation attempts. In the days prior to admission, family reports the patient developed worsening paranoia, auditory and visual hallucinations, aggression toward his mother, and had been unable to sleep for approximately five days. Family further reports he placed his hands on his mother in an aggressive manner, required intervention by stepfather, and demonstrated threatening stares and disorganized behavior. He reportedly fell and hit his head during one episode, with possible seizure activity described by mother.  On ED evaluation, patient was intermittently disoriented, with garbled speech, altered mental status, and poor ability to engage in interview. He admitted to fentanyl  and alprazolam abuse but was unable to clarify quantity or last use. He displayed threatening stares toward providers, though was redirectable. Chart review indicates acceptance to a rehabilitation program ([facility]) was being arranged by his mother, though not immediately available.  During subsequent medical admission, the patient remained intermittently non-verbal, requiring restraints at times, with limited engagement. On 9/16 he was able to shake his head to indicate no suicidal or homicidal ideation and consent for mother involvement in care, and indicated  willingness to pursue rehabilitation. On 9/19 he remained alert but with blunted affect, persistent drooling, slowed responses, and intermittent agitation, necessitating renewal of IVC due to continued concerns for risk to self and others and questionable cognitive status. By 9/22, his condition had improved substantially, with clearer speech, intact orientation, improved motor control, and denial of suicidal or homicidal ideations or perceptual disturbances. He and his mother both expressed agreement with transfer to behavioral health unit for stabilization and planning for post-discharge rehabilitation.  On psychiatric admission interview, patient was alert and oriented, cooperative, and denied suicidal ideation, homicidal ideation, and hallucinations of all modalities. He endorsed recent fentanyl  and alprazolam abuse, with concurrent methadone prescription through [facility] clinic, and acknowledged significant mood lability prior to hospitalization. He denied prior psychiatric hospitalizations, self-harm history, or outpatient psychiatric treatment. 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 Collateral history from mother indicates a recent relapse on fentanyl  and Xanax following approximately three years of sobriety, with increasing paranoia, aggression, and possible seizure prior to hospitalization. She is seeking emergency guardianship and residential treatment placement.  Total Time spent with patient: 1 hour Sleep  Sleep:No data recorded Past Psychiatric History:   Psychiatric History:  Information collected from patient and chart review  Prev Dx/Sx: See above Current Psych Provider: [facility] from methadone Home Meds (current): Home dose of methadone reports was 110 mg Previous Med Trials: He is uncertain Therapy: None  Prior Psych Hospitalization: denies Prior  Self Harm: denies Prior Violence: Denies  Family Psych History: Patient is uncertain Family Hx suicide: Denies  Social History:  Developmental Hx: None reported Educational Hx: some college Occupational Hx: Chief Executive Officer Hx: 10/18/23 [place]  Living Situation: With girlfriend Spiritual Hx: None reported Access to weapons/lethal means: yes   Substance History  Alcohol: denies  History of alcohol withdrawal seizures denies  History of DT's recently  Prescription drug abuse: xanax was usig the 2 mg bars 7-10 a day fo 6 months. Using 3g of fetanyl was also getting methadone dose was 105 mg [facility] Rehab hx: 5-6 years ago   [place] Scale:  Flowsheet Row Admission (Current) from 10/15/2023 in [facility] INPATIENT BEH[facility]IC[facility] ED to Hosp-Admission (Discharged) from 10/05/2023 in [facility] 1C MEDICAL TELEMETRY ED from 10/04/2023 in [facility] Emergency Department at [facility]  C-SSRS RISK CATEGORY Low Risk No Risk No Risk     Past Medical History:  Past Medical History:  Diagnosis Date   Polysubstance abuse [facility])     Past Surgical History:  Procedure Laterality Date   ROTATOR CUFF REPAIR     Family History: History reviewed. No pertinent family history.  Social History:  Social History   Substance and Sexual Activity  Alcohol Use Not Currently     Social History   Substance and Sexual Activity  Drug Use Yes   Comment: heroin, fentanyl       Allergies:  Not on File Lab Results:  Results for orders placed or performed during the hospital encounter of 10/15/23 (from the past 48 hours)  CBC     Status: Abnormal   Collection Time: 10/16/23  6:47 AM  Result Value Ref Range   WBC 11.4 (H) 4.0 - 10.5 K/uL   RBC 4.62 4.22 - 5.81 MIL/uL   Hemoglobin 12.7 (L) 13.0 - 17.0 g/dL   HCT 60.8 60.9 - 47.9 %   MCV 84.6 80.0 - 100.0 fL   MCH 27.5 26.0 - 34.0 pg   MCHC 32.5 30.0 - 36.0 g/dL   RDW 86.2 88.4 - 84.4 %   Platelets 356 150  - 400 K/uL   nRBC 0.0 0.0 - 0.2 %    Comment: Performed at [facility], [address] [place], [place] [address]  Comprehensive metabolic panel     Status: Abnormal   Collection Time: 10/16/23  6:47 AM  Result Value Ref Range   Sodium 140 135 - 145 mmol/L   Potassium 3.6 3.5 - 5.1 mmol/L   Chloride 102 98 - 111 mmol/L   CO2 25 22 - 32 mmol/L   Glucose, Bld 111 (H) 70 - 99 mg/dL    Comment: Glucose reference range applies only to samples taken after fasting for at least 8 hours.   BUN 11 6 - 20 mg/dL   Creatinine, Ser 9.24 0.61 - 1.24 mg/dL   Calcium 9.5 8.9 - 89.6 mg/dL   Total Protein 7.3 6.5 - 8.1 g/dL   Albumin 3.8 3.5 - 5.0 g/dL   AST 22 15 - 41 U/L   ALT 39 0 - 44 U/L   Alkaline Phosphatase 53 38 - 126 U/L   Total Bilirubin 0.4 0.0 - 1.2 mg/dL   GFR, Estimated >39 >39 mL/min    Comment: (NOTE) Calculated using the CKD-EPI Creatinine Equation (2021)    Anion gap 13 5 - 15    Comment: Performed at [facility], [address] [place], [place] [address]    Blood Alcohol level:  Lab Results  Component Value Date   [facility] <15 10/05/2023   ETH <15 10/02/2023    Metabolic Disorder Labs:  No results found for: HGBA1C, MPG No results found for: PROLACTIN Lab Results  Component Value Date   TRIG 257 (H) 05/15/2015    Current Medications: Current  Facility-Administered Medications  Medication Dose Route Frequency Provider Last Rate Last Admin   acetaminophen  (TYLENOL ) tablet 650 mg  650 mg Oral Q6H PRN Smith, Annie B, NP   650 mg at 10/16/23 1102   alum & mag hydroxide-simeth (MAALOX/MYLANTA) 200-200-20 MG/5ML suspension 30 mL  30 mL Oral Q4H PRN Smith, Annie B, NP       amLODipine  (NORVASC ) tablet 10 mg  10 mg Oral Daily Smith, Annie B, NP   10 mg at 10/16/23 9371   [START ON 10/22/2023] cloNIDine  (CATAPRES  - Dosed in mg/24 hr) patch 0.2 mg  0.2 mg Transdermal Weekly Smith, Annie B, NP       haloperidol  (HALDOL ) tablet 5 mg  5 mg Oral TID PRN  McLauchlin, Angela, NP       And   diphenhydrAMINE  (BENADRYL ) capsule 50 mg  50 mg Oral TID PRN McLauchlin, Angela, NP   50 mg at 10/16/23 0343   haloperidol  lactate (HALDOL ) injection 5 mg  5 mg Intramuscular TID PRN McLauchlin, Jon, NP       And   diphenhydrAMINE  (BENADRYL ) injection 50 mg  50 mg Intramuscular TID PRN McLauchlin, Jon, NP       And   LORazepam  (ATIVAN ) injection 2 mg  2 mg Intramuscular TID PRN McLauchlin, Jon, NP       haloperidol  lactate (HALDOL ) injection 10 mg  10 mg Intramuscular TID PRN McLauchlin, Jon, NP       And   diphenhydrAMINE  (BENADRYL ) injection 50 mg  50 mg Intramuscular TID PRN McLauchlin, Jon, NP       And   LORazepam  (ATIVAN ) injection 2 mg  2 mg Intramuscular TID PRN McLauchlin, Angela, NP       labetalol  (NORMODYNE ) tablet 300 mg  300 mg Oral TID Smith, Annie B, NP   300 mg at 10/16/23 9176   lisinopril  (ZESTRIL ) tablet 10 mg  10 mg Oral Daily Smith, Annie B, NP   10 mg at 10/16/23 9371   magnesium  hydroxide (MILK OF MAGNESIA) suspension 30 mL  30 mL Oral Daily PRN Smith, Annie B, NP       nicotine  (NICODERM CQ  - dosed in mg/24 hours) patch 21 mg  21 mg Transdermal Daily Jessyca Sloan E, PA-C   21 mg at 10/16/23 9177   OLANZapine  (ZYPREXA ) injection 10 mg  10 mg Intramuscular TID PRN McLauchlin, Angela, NP       OLANZapine  (ZYPREXA ) tablet 5 mg  5 mg Oral QHS Smith, Annie B, NP   5 mg at 10/15/23 2105   OLANZapine  zydis (ZYPREXA ) disintegrating tablet 5 mg  5 mg Oral TID PRN Smith, Annie B, NP   5 mg at 10/16/23 1101   traZODone  (DESYREL ) tablet 50 mg  50 mg Oral QHS PRN Smith, Annie B, NP   50 mg at 10/15/23 2105   PTA Medications: Medications Prior to Admission  Medication Sig Dispense Refill Last Dose/Taking   acetaminophen  (TYLENOL ) 325 MG tablet Take 2 tablets (650 mg total) by mouth every 6 (six) hours as needed for mild pain (pain score 1-3) or fever (or Fever >/= 101).      amLODipine  (NORVASC ) 10 MG tablet Take 1 tablet  (10 mg total) by mouth daily.      [START ON 10/22/2023] cloNIDine  (CATAPRES  - DOSED IN MG/24 HR) 0.2 mg/24hr patch Place 1 patch (0.2 mg total) onto the skin once a week.      labetalol  (NORMODYNE ) 300 MG tablet Take 1 tablet (300 mg total)  by mouth 3 (three) times daily.      lisinopril  (ZESTRIL ) 10 MG tablet Take 1 tablet (10 mg total) by mouth daily.      OLANZapine  (ZYPREXA ) 5 MG tablet Take 1 tablet (5 mg total) by mouth at bedtime.       Psychiatric Specialty Exam:  Presentation  General Appearance:  Casual  Eye Contact: Fair  Speech: Clear and Coherent  Speech Volume: Normal    Mood and Affect  Mood: Euthymic  Affect: Congruent   Thought Process  Thought Processes: Goal Directed; Coherent  Descriptions of Associations:Intact  Orientation:Full (Time, Place and Person)  Thought Content:WDL  Hallucinations:No data recorded Ideas of Reference:None  Suicidal Thoughts:No data recorded Homicidal Thoughts:No data recorded  Sensorium  Memory: Immediate Fair  Judgment: Poor  Insight: Shallow   Executive Functions  Concentration: Fair  Attention Span: Fair  Recall: Fair  Fund of Knowledge: Fair  Language: Fair   Psychomotor Activity  Psychomotor Activity:No data recorded  Assets  Assets: Communication Skills; Desire for Improvement    Musculoskeletal: Strength & Muscle Tone: within normal limits Gait & Station: normal  Physical Exam: Physical Exam Vitals and nursing note reviewed.  HENT:     Head: Atraumatic.  Eyes:     Extraocular Movements: Extraocular movements intact.  Pulmonary:     Effort: Pulmonary effort is normal.  Neurological:     Mental Status: He is alert and oriented to person, place, and time.    Review of Systems  Psychiatric/Behavioral:  Positive for substance abuse. Negative for depression, hallucinations and suicidal ideas. The patient is nervous/anxious and has insomnia.    Blood pressure (!)  139/93, pulse 82, temperature (!) 97.2 F (36.2 C), resp. rate 18, height 6' 5 (1.956 m), weight 115.2 kg, SpO2 100%. Body mass index is 30.12 kg/m.  Principal Diagnosis: Substance induced mood disorder (HCC) Diagnosis:  Principal Problem:   Substance induced mood disorder (HCC)   Clinical Decision Making: This is a 33 year old male with severe polysubstance use disorder (fentanyl , alprazolam, methadone) admitted under IVC for escalating aggression, paranoia, hallucinations, and disorganized behavior in the context of acute intoxication and withdrawal. He required phenobarbital  taper during his medical admission for management of benzodiazepine withdrawal, highlighting the severity of his dependence and risk of withdrawal complications. His presentation on admission was notable for altered mental status, aggression, and impaired ability to participate in interview, raising concerns for imminent risk to self and others.  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 Nonetheless, his longstanding history of heavy fentanyl  and benzodiazepine use, impaired judgment, relapse after prior periods of sobriety, aggression toward family, and lack of adherence to outpatient care necessitate inpatient psychiatric admission for continued stabilization, relapse-prevention planning, and coordination of structured residential rehabilitation.  Emergency guardianship proceedings by mother further underscore impaired decision-making capacity and high relapse risk. The patient remains appropriate for inpatient psychiatric admission under IVC for safety, detox continuation, medication management, and comprehensive discharge planning.  Treatment Plan Summary:  Safety and Monitoring:              -- Voluntary admission to inpatient psychiatric unit for safety, stabilization and treatment             -- Daily contact with patient to assess and evaluate symptoms and progress in treatment             --  Patient's case to be discussed in multi-disciplinary team meeting             -- Observation Level: q15 minute checks             -- Vital signs:  q12 hours             -- Precautions: suicide, elopement, and assault   2. Psychiatric Diagnoses and Treatment:         Substance-induced mood disorder Increase Zyprexa  to 10 mg nightly for sleep and mood stabilization Start Prozac  10 mg daily for anxiety and depression      -- The risks/benefits/side-effects/alternatives to this medication were discussed in detail with the patient and time was given for questions. The patient consents to medication trial.                -- Metabolic profile and EKG monitoring obtained while on an atypical antipsychotic (BMI: Lipid Panel: HbgA1c: QTc:)              -- Encouraged patient to participate in unit milieu and in scheduled group therapies                            3. Medical Issues Being Addressed:  Cleared following medical admission no acute concerns   4. Discharge Planning:              -- Social work and case management to assist with discharge planning and identification of hospital follow-up needs prior to discharge             -- Estimated LOS: 5-7 days             -- Discharge Concerns: Need to establish a safety plan; Medication compliance and effectiveness             -- Discharge Goals: Return home with outpatient referrals follow ups  Physician Treatment Plan for Primary Diagnosis: Substance induced mood disorder (HCC) Long Term Goal(s): Improvement in symptoms so as ready for discharge  Short Term Goals: Ability to identify and develop effective coping behaviors will improve, Ability to maintain clinical measurements within normal limits will improve, Compliance with  prescribed medications will improve, and Ability to identify triggers associated with substance abuse/mental health issues will improve   I certify that inpatient services furnished can reasonably be expected to improve the patient's condition.    Donnice FORBES Right, PA-C 9/23/202511:52 AM

## 2023-10-16 NOTE — Group Note (Signed)
 [facility] LCSW Group Therapy Note   Group Date: 10/16/2023 Start Time: 1300 End Time: 1400  Type of Therapy/Topic:  Group Therapy:  Feelings about Diagnosis  Participation Level:  Did Not Attend    Description of Group:    This group will allow patients to explore their thoughts and feelings about diagnoses they have received. Patients will be guided to explore their level of understanding and acceptance of these diagnoses. Facilitator will encourage patients to process their thoughts and feelings about the reactions of others to their diagnosis, and will guide patients in identifying ways to discuss their diagnosis with significant others in their lives. This group will be process-oriented, with patients participating in exploration of their own experiences as well as giving and receiving support and challenge from other group members.   Therapeutic Goals: 1. Patient will demonstrate understanding of diagnosis as evidence by identifying two or more symptoms of the disorder:  2. Patient will be able to express two feelings regarding the diagnosis 3. Patient will demonstrate ability to communicate their needs through discussion and/or role plays  Summary of Patient Progress: X   Therapeutic Modalities:   Cognitive Behavioral Therapy Brief Therapy Feelings Identification    Nadara JONELLE Fam, LCSW

## 2023-10-16 NOTE — [facility] Suicide Risk Assessment (Signed)
 [facility] INPATIENT:  Family/Significant Other Suicide Prevention Education  Suicide Prevention Education:  Education Completed; Leita Ricks, mother, [phone] has been identified by the patient as the family member/significant other with whom the patient will be residing, and identified as the person(s) who will aid the patient in the event of a mental health crisis (suicidal ideations/suicide attempt).  With written consent from the patient, the family member/significant other has been provided the following suicide prevention education, prior to the and/or following the discharge of the patient.  The suicide prevention education provided includes the following: Suicide risk factors Suicide prevention and interventions National Suicide Hotline telephone number [facility] assessment telephone number [facility] Emergency Assistance 911 [facility] and/or Residential Mobile Crisis Unit telephone number  Request made of family/significant other to: Remove weapons (e.g., guns, rifles, knives), all items previously/currently identified as safety concern.   Remove drugs/medications (over-the-counter, prescriptions, illicit drugs), all items previously/currently identified as a safety concern.  The family member/significant other verbalizes understanding of the suicide prevention education information provided.  The family member/significant other agrees to remove the items of safety concern listed above.  Sherryle JINNY Margo 10/16/2023, 1:47 PM

## 2023-10-16 NOTE — Group Note (Signed)
 Date:  10/16/2023 Time:  2:16 PM  Group Topic/Focus:  Rediscovering Joy:   The focus of this group is to explore various ways to relieve stress in a positive manner.    Participation Level:  Did Not Attend   Skippy LITTIE Bennett 10/16/2023, 2:16 PM

## 2023-10-16 NOTE — Group Note (Signed)
 Recreation Therapy Group Note   Group Topic:Healthy Support Systems  Group Date: 10/16/2023 Start Time: 1530 End Time: 1620 Facilitators: Celestia Jeoffrey FORBES ARTICE, CTRS Location: Craft Room  Group Description: Straw Bridge. In groups or individually, patients were given 10 plastic drinking straws and an equal length of masking tape. Using the materials provided, patients were instructed to build a free-standing bridge-like structure to suspend an everyday item (ex: deck of cards) off the floor or table surface. All materials were required to be used in Secondary school teacher. LRT facilitated post-activity discussion reviewing the importance of having strong and healthy support systems in our lives. LRT discussed how the people in our lives serve as the tape and the deck of cards we placed on top of our straw structure are the stressors we face in daily life. LRT and pts discussed what happens in our life when things get too heavy for us , and we don't have strong supports outside of the hospital. Pt shared 2 of their healthy supports in their life aloud in the group.   Goal Area(s) Addressed:  Patient will identify 2 healthy supports in their life. Patient will identify skills to successfully complete activity. Patient will identify correlation of this activity to life post-discharge.  Patient will build on frustration tolerance skills. Patient will increase team building and communication skills.   Affect/Mood: N/A   Participation Level: Did not attend    Clinical Observations/Individualized Feedback: Patient did not attend group.   Plan: Continue to engage patient in RT group sessions 2-3x/week.   [address] LRT, CTRS 10/16/2023 5:03 PM
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
## 2023-10-16 NOTE — Progress Notes (Signed)
 Patient presents: Flat affect and anxious with slight tremors on hands bilaterally. Overall cooperative.    SI/HI/AVH: Denies   Plan: Denies   Groups attended: 0   Appetite: Adequate. Attended meals.   Sleep: Patient reports very poor sleep and reports feeling very tired today. Provider notified.    PRNS: Zyprexa  and tylenol  @ 11:01 am due to increasing irritability and anxiety. Patient also given hydroxyzine  po @ 12:10pm for continued anxiety.    Disturbances: No disturbances thus far. Patient remains redirectable in milieu.    Questions/concerns: No further questions or concerns.   VS: BP (!) 141/100 (BP Location: Left Arm)   Pulse (!) 102   Temp 98 F (36.7 C)   Resp 18   Ht 6' 5 (1.956 m)   Wt 115.2 kg   SpO2 99%   BMI 30.12 kg/m   Patient given scheduled BP medication.

## 2023-10-16 NOTE — Plan of Care (Signed)
   Problem: Coping: Goal: Ability to verbalize frustrations and anger appropriately will improve Outcome: Progressing Goal: Ability to demonstrate self-control will improve Outcome: Progressing   Problem: Health Behavior/Discharge Planning: Goal: Compliance with treatment plan for underlying cause of condition will improve Outcome: Progressing   Problem: Safety: Goal: Periods of time without injury will increase Outcome: Progressing
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
## 2023-10-16 NOTE — Group Note (Signed)
 Recreation Therapy Group Note   Group Topic:Health and Wellness  Group Date: 10/16/2023 Start Time: 1000 End Time: 1100 Facilitators: Celestia Jeoffrey BRAVO, LRT, CTRS Location: Courtyard  Group Description: [employer]. LRT and patients played games of basketball, drew with chalk, and played corn hole while outside in the courtyard while getting fresh air and sunlight. Music was being played in the background. LRT and peers conversed about different games they have played before, what they do in their free time and anything else that is on their minds. LRT encouraged pts to drink water after being outside, sweating and getting their heart rate up.  Goal Area(s) Addressed: Patient will build on frustration tolerance skills. Patients will partake in a competitive play game with peers. Patients will gain knowledge of new leisure interest/hobby.    Affect/Mood: N/A   Participation Level: Did not attend    Clinical Observations/Individualized Feedback: Patient did not attend group.   Plan: Continue to engage patient in RT group sessions 2-3x/week.   Jeoffrey BRAVO Celestia, LRT, CTRS 10/16/2023 11:14 AM

## 2023-10-16 NOTE — Progress Notes (Signed)
 Pt calm and pleasant during assessment denying SI/HI/AVH. Pt endorses anxiety over not being able to get good sleep. Pt observed interacting appropriate with staff and peers on the unit. Pt compliant with medication administration per MD orders. Pt given education, support, and encouragement to be active in his treatment plan. Pt being monitored Q 15 minutes for safety per unit protocol, remains safe on the unit

## 2023-10-16 NOTE — BHH Suicide Risk Assessment (Signed)
 [facility] Admission Suicide Risk Assessment   Nursing information obtained from:  Patient Demographic factors:  Male, Caucasian, Unemployed, Adolescent or young adult Current Mental Status:  NA Loss Factors:  NA Historical Factors:  NA Risk Reduction Factors:  Responsible for children under 32 years of age  Total Time spent with patient: 30 minutes Principal Problem: Substance induced mood disorder (HCC) Diagnosis:  Principal Problem:   Substance induced mood disorder (HCC)  Subjective Data: This is a 33 year old male with severe polysubstance use disorder (fentanyl , alprazolam, methadone) admitted under IVC for escalating aggression, paranoia, hallucinations, and disorganized behavior in the context of acute intoxication and withdrawal. He required phenobarbital  taper during his medical admission for management of benzodiazepine withdrawal, highlighting the severity of his dependence and risk of withdrawal complications. His presentation on admission was notable for altered mental status, aggression, and impaired ability to participate in interview, raising concerns for imminent risk to self and others.  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 Nonetheless, his longstanding history of heavy fentanyl  and benzodiazepine use, impaired judgment, relapse after prior periods of sobriety, aggression toward family, and lack of adherence to outpatient care necessitate inpatient psychiatric admission for continued stabilization, relapse-prevention planning, and coordination of structured residential rehabilitation.  Emergency guardianship proceedings by mother further underscore impaired decision-making capacity and high relapse risk. The patient remains appropriate for  inpatient psychiatric admission under IVC for safety, detox continuation, medication management, and comprehensive discharge planning.  Continued Clinical Symptoms:  Alcohol Use Disorder Identification Test Final Score (AUDIT): 1 The Alcohol Use Disorders Identification Test, Guidelines for Use in Primary Care, Second Edition.  World Science writer [facility]). Score between 0-7:  no or low risk or alcohol related problems. Score between 8-15:  moderate risk of alcohol related problems. Score between 16-19:  high risk of alcohol related problems. Score 20 or above:  warrants further diagnostic evaluation for alcohol dependence and treatment.   CLINICAL FACTORS:   Alcohol/Substance Abuse/Dependencies   Musculoskeletal: Strength & Muscle Tone: within normal limits Gait & Station: normal Patient leans: N/A  Psychiatric Specialty Exam:  Presentation  General Appearance:  Casual  Eye Contact: Fair  Speech: Clear and Coherent  Speech Volume: Normal  Handedness: Right   Mood and Affect  Mood: Euthymic  Affect: Congruent   Thought Process  Thought Processes: Goal Directed; Coherent  Descriptions of Associations:Intact  Orientation:Full (Time, Place and Person)  Thought Content:WDL  History of Schizophrenia/Schizoaffective disorder:No  Duration of Psychotic Symptoms:No data recorded Hallucinations:No data recorded Ideas of Reference:None  Suicidal Thoughts:No data recorded Homicidal Thoughts:No data recorded  Sensorium  Memory: Immediate Fair  Judgment: Poor  Insight: Shallow   Executive Functions  Concentration: Fair  Attention Span: Fair  Recall: Fair  Fund of Knowledge: Fair  Language: Fair   Psychomotor Activity  Psychomotor Activity:No data recorded  Assets  Assets: Communication Skills; Desire for Improvement   Sleep  Sleep:No data recorded   Physical Exam: Physical Exam ROS Blood pressure (!) 138/95, pulse (!)  101, temperature (!) 97.2 F (36.2 C), resp. rate 18, height 6' 5 (1.956 m), weight 115.2 kg, SpO2 99%. Body mass index is 30.12 kg/m.   COGNITIVE FEATURES THAT CONTRIBUTE TO RISK:  None    SUICIDE RISK:   Minimal: No identifiable suicidal ideation.  Patients presenting with no risk factors but with morbid ruminations; may be classified as minimal risk  based on the severity of the depressive symptoms  PLAN OF CARE: Safety and Monitoring:   -- admission to inpatient psychiatric unit for safety, stabilization and treatment -- Daily contact with patient to assess and evaluate symptoms and progress in treatment -- Patient's case to be discussed in multi-disciplinary team meeting -- Observation Level : q15 minute checks -- Vital signs:  q12 hours -- Precautions: suicide   I certify that inpatient services furnished can reasonably be expected to improve the patient's condition.   Donnice FORBES Right, PA-C 10/16/2023, 3:48 PM

## 2023-10-16 NOTE — Group Note (Signed)
 Date:  10/16/2023 Time:  8:53 PM  Group Topic/Focus:  Building Self Esteem:   The Focus of this group is helping patients become aware of the effects of self-esteem on their lives, the things they and others do that enhance or undermine their self-esteem, seeing the relationship between their level of self-esteem and the choices they make and learning ways to enhance self-esteem.    Participation Level:  Active  Participation Quality:  Appropriate, Attentive, Sharing, and Supportive  Affect:  Appropriate  Cognitive:  Appropriate  Insight: Appropriate and Good  Engagement in Group:  Engaged  Modes of Intervention:  Discussion and Role-play  Additional Comments:     Kerri Katz 10/16/2023, 8:53 PM

## 2023-10-17 DIAGNOSIS — F1994 Other psychoactive substance use, unspecified with psychoactive substance-induced mood disorder: Secondary | ICD-10-CM | POA: Diagnosis not present

## 2023-10-17 NOTE — Plan of Care (Signed)
   Problem: Education: Goal: Emotional status will improve Outcome: Progressing Goal: Mental status will improve Outcome: Progressing

## 2023-10-17 NOTE — BH IP Treatment Plan (Signed)
 Interdisciplinary Treatment and Diagnostic Plan Update  10/17/2023 Time of Session: 10:27 AM Russell Kennedy MRN: [id number]  Principal Diagnosis: Substance induced mood disorder (HCC)  Secondary Diagnoses: Principal Problem:   Substance induced mood disorder (HCC)   Current Medications:  Current Facility-Administered Medications  Medication Dose Route Frequency Provider Last Rate Last Admin   acetaminophen  (TYLENOL ) tablet 650 mg  650 mg Oral Q6H PRN Smith, Annie B, NP   650 mg at 10/16/23 1930   alum & mag hydroxide-simeth (MAALOX/MYLANTA) 200-200-20 MG/5ML suspension 30 mL  30 mL Oral Q4H PRN Smith, Annie B, NP       amLODipine  (NORVASC ) tablet 10 mg  10 mg Oral Daily Smith, Annie B, NP   10 mg at 10/17/23 0823   [START ON 10/22/2023] cloNIDine  (CATAPRES  - Dosed in mg/24 hr) patch 0.2 mg  0.2 mg Transdermal Weekly Smith, Annie B, NP       haloperidol  (HALDOL ) tablet 5 mg  5 mg Oral TID PRN McLauchlin, Angela, NP       And   diphenhydrAMINE  (BENADRYL ) capsule 50 mg  50 mg Oral TID PRN McLauchlin, Angela, NP   50 mg at 10/16/23 2104   haloperidol  lactate (HALDOL ) injection 5 mg  5 mg Intramuscular TID PRN McLauchlin, Jon, NP       And   diphenhydrAMINE  (BENADRYL ) injection 50 mg  50 mg Intramuscular TID PRN McLauchlin, Jon, NP       And   LORazepam  (ATIVAN ) injection 2 mg  2 mg Intramuscular TID PRN McLauchlin, Angela, NP   2 mg at 10/17/23 0112   haloperidol  lactate (HALDOL ) injection 10 mg  10 mg Intramuscular TID PRN McLauchlin, Jon, NP       And   diphenhydrAMINE  (BENADRYL ) injection 50 mg  50 mg Intramuscular TID PRN McLauchlin, Jon, NP       And   LORazepam  (ATIVAN ) injection 2 mg  2 mg Intramuscular TID PRN McLauchlin, Angela, NP       FLUoxetine  (PROZAC ) capsule 10 mg  10 mg Oral Daily Millington, Matthew E, PA-C   10 mg at 10/17/23 9177   hydrOXYzine  (ATARAX ) tablet 50 mg  50 mg Oral TID PRN Millington, Matthew E, PA-C   50 mg at 10/17/23 9175   labetalol   (NORMODYNE ) tablet 300 mg  300 mg Oral TID Smith, Annie B, NP   300 mg at 10/17/23 9175   lisinopril  (ZESTRIL ) tablet 10 mg  10 mg Oral Daily Smith, Annie B, NP   10 mg at 10/17/23 9176   magnesium  hydroxide (MILK OF MAGNESIA) suspension 30 mL  30 mL Oral Daily PRN Smith, Annie B, NP       nicotine  (NICODERM CQ  - dosed in mg/24 hours) patch 21 mg  21 mg Transdermal Daily Millington, Matthew E, PA-C   21 mg at 10/17/23 9176   OLANZapine  (ZYPREXA ) injection 10 mg  10 mg Intramuscular TID PRN McLauchlin, Angela, NP       OLANZapine  (ZYPREXA ) tablet 10 mg  10 mg Oral QHS Millington, Matthew E, PA-C   10 mg at 10/16/23 2104   OLANZapine  zydis (ZYPREXA ) disintegrating tablet 5 mg  5 mg Oral TID PRN Smith, Annie B, NP   5 mg at 10/16/23 1101   traZODone  (DESYREL ) tablet 50 mg  50 mg Oral QHS PRN Smith, Annie B, NP   50 mg at 10/16/23 2104   PTA Medications: Medications Prior to Admission  Medication Sig Dispense Refill Last Dose/Taking   acetaminophen  (TYLENOL ) 325 MG  tablet Take 2 tablets (650 mg total) by mouth every 6 (six) hours as needed for mild pain (pain score 1-3) or fever (or Fever >/= 101).      amLODipine  (NORVASC ) 10 MG tablet Take 1 tablet (10 mg total) by mouth daily.      [START ON 10/22/2023] cloNIDine  (CATAPRES  - DOSED IN MG/24 HR) 0.2 mg/24hr patch Place 1 patch (0.2 mg total) onto the skin once a week.      labetalol  (NORMODYNE ) 300 MG tablet Take 1 tablet (300 mg total) by mouth 3 (three) times daily.      lisinopril  (ZESTRIL ) 10 MG tablet Take 1 tablet (10 mg total) by mouth daily.      OLANZapine  (ZYPREXA ) 5 MG tablet Take 1 tablet (5 mg total) by mouth at bedtime.       Patient Stressors: Health problems   Substance abuse    Patient Strengths: Manufacturing systems engineer  Physical Health  Supportive family/friends  Work skills   Treatment Modalities: Medication Management, Group therapy, Case management,  1 to 1 session with clinician, Psychoeducation, Recreational  therapy.   Physician Treatment Plan for Primary Diagnosis: Substance induced mood disorder (HCC) Long Term Goal(s): Improvement in symptoms so as ready for discharge   Short Term Goals: Ability to identify and develop effective coping behaviors will improve Ability to maintain clinical measurements within normal limits will improve Compliance with prescribed medications will improve Ability to identify triggers associated with substance abuse/mental health issues will improve  Medication Management: Evaluate patient's response, side effects, and tolerance of medication regimen.  Therapeutic Interventions: 1 to 1 sessions, Unit Group sessions and Medication administration.  Evaluation of Outcomes: Not Met  Physician Treatment Plan for Secondary Diagnosis: Principal Problem:   Substance induced mood disorder (HCC)  Long Term Goal(s): Improvement in symptoms so as ready for discharge   Short Term Goals: Ability to identify and develop effective coping behaviors will improve Ability to maintain clinical measurements within normal limits will improve Compliance with prescribed medications will improve Ability to identify triggers associated with substance abuse/mental health issues will improve     Medication Management: Evaluate patient's response, side effects, and tolerance of medication regimen.  Therapeutic Interventions: 1 to 1 sessions, Unit Group sessions and Medication administration.  Evaluation of Outcomes: Not Met   RN Treatment Plan for Primary Diagnosis: Substance induced mood disorder (HCC) Long Term Goal(s): Knowledge of disease and therapeutic regimen to maintain health will improve  Short Term Goals: Ability to verbalize frustration and anger appropriately will improve, Ability to demonstrate self-control, Ability to participate in decision making will improve, Ability to verbalize feelings will improve, Ability to disclose and discuss suicidal ideas, and Ability to  identify and develop effective coping behaviors will improve  Medication Management: RN will administer medications as ordered by provider, will assess and evaluate patient's response and provide education to patient for prescribed medication. RN will report any adverse and/or side effects to prescribing provider.  Therapeutic Interventions: 1 on 1 counseling sessions, Psychoeducation, Medication administration, Evaluate responses to treatment, Monitor vital signs and CBGs as ordered, Perform/monitor CIWA, COWS, AIMS and Fall Risk screenings as ordered, Perform wound care treatments as ordered.  Evaluation of Outcomes: Not Met   LCSW Treatment Plan for Primary Diagnosis: Substance induced mood disorder (HCC) Long Term Goal(s): Safe transition to appropriate next level of care at discharge, Engage patient in therapeutic group addressing interpersonal concerns.  Short Term Goals: Engage patient in aftercare planning with referrals and resources, Increase social support,  Increase ability to appropriately verbalize feelings, Increase emotional regulation, Facilitate acceptance of mental health diagnosis and concerns, Facilitate patient progression through stages of change regarding substance use diagnoses and concerns, Identify triggers associated with mental health/substance abuse issues, and Increase skills for wellness and recovery  Therapeutic Interventions: Assess for all discharge needs, 1 to 1 time with Social worker, Explore available resources and support systems, Assess for adequacy in community support network, Educate family and significant other(s) on suicide prevention, Complete Psychosocial Assessment, Interpersonal group therapy.  Evaluation of Outcomes: Not Met   Progress in Treatment: Attending groups: Yes. and No. Participating in groups: Yes. and No. Taking medication as prescribed: Yes. Toleration medication: Yes. Family/Significant other contact made: Yes, individual(s)  contacted:  Education Completed; Leita Ricks, mother, [phone] has been identified by the patient as the family member/significant other with whom the patient will be residing, and identified as the person(s) who will aid the patient in the event of a mental health crisis (suicidal ideations/suicide attempt).  With written consent from the patient, the family member/significant other has been provided the following suicide prevention education, prior to the and/or following the discharge of the patient. Patient understands diagnosis: Yes. Discussing patient identified problems/goals with staff: Yes. Medical problems stabilized or resolved: Yes. Denies suicidal/homicidal ideation: Yes. Issues/concerns per patient self-inventory: No. Other: None  New problem(s) identified: No, Describe:  None  New Short Term/Long Term Goal(s):detox, elimination of symptoms of psychosis, medication management for mood stabilization; elimination of SI thoughts; development of comprehensive mental wellness/sobriety plan.    Patient Goals:  Try to get in the program in [place] .  Discharge Plan or Barriers: CSW to assist with the development of appropriate discharge plan.    Reason for Continuation of Hospitalization: Anxiety Depression Suicidal ideation Withdrawal symptoms  Estimated Length of Stay: 1-7 days.   Last 3 [place] Suicide Severity Risk Score: Flowsheet Row Admission (Current) from 10/15/2023 in [facility] INPATIENT BEHAVIORAL MEDICINE ED to Hosp-Admission (Discharged) from 10/05/2023 in [facility] 1C MEDICAL TELEMETRY ED from 10/04/2023 in [facility] Emergency Department at [facility]  C-SSRS RISK CATEGORY Low Risk No Risk No Risk    Last PHQ 2/9 Scores:     No data to display          Scribe for Treatment Team: Alveta CHRISTELLA Kerns, KEN 10/17/2023 10:48 AM

## 2023-10-17 NOTE — Group Note (Signed)
 Date:  10/17/2023 Time:  4:58 PM  Group Topic/Focus:  Personal Choices and Values:   The focus of this group is to help patients assess and explore the importance of values in their lives, how their values affect their decisions, how they express their values and what opposes their expression.    Participation Level:  Did Not Attend   [name] HERO Hubbard Seldon 10/17/2023, 4:58 PM

## 2023-10-17 NOTE — BHH Counselor (Addendum)
 CSW spoke with the patient's mother.  Mother reports concerns on if she should continue pursuing guardianship.    CSW advised that CSW can not advise, however, mother can speak to her attorney.  CSW spoke with pt and mother about concern about pt not taking psychotropic medications if admitted to the program in [place] .  Meeting was tentatively set for 1PM on 10/18/2023.  Sherryle Margo, MSW, LCSW 10/17/2023 3:35 PM

## 2023-10-17 NOTE — Group Note (Signed)
 Date:  10/17/2023 Time:  5:04 PM  Group Topic/Focus:  Activity Group: The focus of the group is to promote activity and encourage patients to go outside to the courtyard to get some fresh air and some exercise.    Participation Level:  Did Not Attend   [name] HERO Russell Kennedy 10/17/2023, 5:04 PM

## 2023-10-17 NOTE — Group Note (Signed)
 Date:  10/17/2023 Time:  8:38 PM  Group Topic/Focus:  Wrap-Up Group:   The focus of this group is to help patients review their daily goal of treatment and discuss progress on daily workbooks.    Participation Level:  Active  Participation Quality:  Appropriate and Attentive  Affect:  Appropriate  Cognitive:  Alert and Appropriate  Insight: Appropriate and Good  Engagement in Group:  Engaged  Modes of Intervention:  Orientation  Additional Comments:    Russell Kennedy 10/17/2023, 8:38 PM

## 2023-10-17 NOTE — Progress Notes (Signed)
 [facility] MD Progress Note  10/17/2023 9:34 AM Russell Kennedy  MRN:  [id number]   Subjective:  Chart reviewed, case discussed in multidisciplinary meeting, patient seen during rounds.    9/24: Patient seen for follow up, they are found laying in bed. They are alert and oriented on exam. Notes that they got anxious last night due to having difficulty getting comfortable and received prn zyprexa . They report they were able to sleep well last night and that has significantly helped with their anxiety. They rate depression at 3/10 with primary sx bing fatigue. They rate anxiety at 1/10. They discuss their mother and girlfriend as their support system. They deny si/hi/avh. They are linear on exam. They are future oriented continuing to discuss their plan to go to treatment. Encouraged them to start participating in group and the unit milieu. They are performing Adls, and are having regular bowel movements.     Sleep: improving  Appetite:  Good  Past Psychiatric History: see h&P Family History: History reviewed. No pertinent family history. Social History:  Social History   Substance and Sexual Activity  Alcohol Use Not Currently     Social History   Substance and Sexual Activity  Drug Use Yes   Comment: heroin, fentanyl     Social History   Socioeconomic History   Marital status: Single    Spouse name: Not on file   Number of children: Not on file   Years of education: Not on file   Highest education level: Not on file  Occupational History   Not on file  Tobacco Use   Smoking status: Every Day    Types: Cigarettes   Smokeless tobacco: Never  Vaping Use   Vaping status: Never Used  Substance and Sexual Activity   Alcohol use: Not Currently   Drug use: Yes    Comment: heroin, fentanyl    Sexual activity: Yes  Other Topics Concern   Not on file  Social History Narrative   Not on file   Social Drivers of Health   Financial Resource Strain: Not on file  Food Insecurity: No  Food Insecurity (10/15/2023)   Hunger Vital Sign    Worried About Running Out of Food in the Last Year: Never true    Ran Out of Food in the Last Year: Never true  Transportation Needs: No Transportation Needs (10/15/2023)   PRAPARE - Transportation    Lack of Transportation (Medical): No    Lack of Transportation (Non-Medical): No  Physical Activity: Not on file  Stress: Not on file  Social Connections: Not on file   Past Medical History:  Past Medical History:  Diagnosis Date   Polysubstance abuse (HCC)     Past Surgical History:  Procedure Laterality Date   ROTATOR CUFF REPAIR      Current Medications: Current Facility-Administered Medications  Medication Dose Route Frequency Provider Last Rate Last Admin   acetaminophen  (TYLENOL ) tablet 650 mg  650 mg Oral Q6H PRN Smith, Annie B, NP   650 mg at 10/16/23 1930   alum & mag hydroxide-simeth (MAALOX/MYLANTA) 200-200-20 MG/5ML suspension 30 mL  30 mL Oral Q4H PRN Smith, Annie B, NP       amLODipine  (NORVASC ) tablet 10 mg  10 mg Oral Daily Smith, Annie B, NP   10 mg at 10/17/23 0823   [START ON 10/22/2023] cloNIDine  (CATAPRES  - Dosed in mg/24 hr) patch 0.2 mg  0.2 mg Transdermal Weekly Smith, Annie B, NP       haloperidol  (HALDOL ) tablet  5 mg  5 mg Oral TID PRN McLauchlin, Angela, NP       And   diphenhydrAMINE  (BENADRYL ) capsule 50 mg  50 mg Oral TID PRN McLauchlin, Angela, NP   50 mg at 10/16/23 2104   haloperidol  lactate (HALDOL ) injection 5 mg  5 mg Intramuscular TID PRN McLauchlin, Jon, NP       And   diphenhydrAMINE  (BENADRYL ) injection 50 mg  50 mg Intramuscular TID PRN McLauchlin, Angela, NP       And   LORazepam  (ATIVAN ) injection 2 mg  2 mg Intramuscular TID PRN McLauchlin, Angela, NP   2 mg at 10/17/23 0112   haloperidol  lactate (HALDOL ) injection 10 mg  10 mg Intramuscular TID PRN McLauchlin, Jon, NP       And   diphenhydrAMINE  (BENADRYL ) injection 50 mg  50 mg Intramuscular TID PRN McLauchlin, Jon, NP        And   LORazepam  (ATIVAN ) injection 2 mg  2 mg Intramuscular TID PRN McLauchlin, Angela, NP       FLUoxetine  (PROZAC ) capsule 10 mg  10 mg Oral Daily Braedon Sjogren E, PA-C   10 mg at 10/17/23 9177   hydrOXYzine  (ATARAX ) tablet 50 mg  50 mg Oral TID PRN Majestic Molony E, PA-C   50 mg at 10/17/23 9175   labetalol  (NORMODYNE ) tablet 300 mg  300 mg Oral TID Smith, Annie B, NP   300 mg at 10/17/23 9175   lidocaine  (LIDODERM ) 5 % 1 patch  1 patch Transdermal Once McLauchlin, Angela, NP   1 patch at 10/16/23 2139   lisinopril  (ZESTRIL ) tablet 10 mg  10 mg Oral Daily Smith, Annie B, NP   10 mg at 10/17/23 9176   magnesium  hydroxide (MILK OF MAGNESIA) suspension 30 mL  30 mL Oral Daily PRN Smith, Annie B, NP       nicotine  (NICODERM CQ  - dosed in mg/24 hours) patch 21 mg  21 mg Transdermal Daily Fulton Merry E, PA-C   21 mg at 10/17/23 9176   OLANZapine  (ZYPREXA ) injection 10 mg  10 mg Intramuscular TID PRN McLauchlin, Angela, NP       OLANZapine  (ZYPREXA ) tablet 10 mg  10 mg Oral QHS Anahi Belmar E, PA-C   10 mg at 10/16/23 2104   OLANZapine  zydis (ZYPREXA ) disintegrating tablet 5 mg  5 mg Oral TID PRN Smith, Annie B, NP   5 mg at 10/16/23 1101   traZODone  (DESYREL ) tablet 50 mg  50 mg Oral QHS PRN Smith, Annie B, NP   50 mg at 10/16/23 2104    Lab Results:  Results for orders placed or performed during the hospital encounter of 10/15/23 (from the past 48 hours)  CBC     Status: Abnormal   Collection Time: 10/16/23  6:47 AM  Result Value Ref Range   WBC 11.4 (H) 4.0 - 10.5 K/uL   RBC 4.62 4.22 - 5.81 MIL/uL   Hemoglobin 12.7 (L) 13.0 - 17.0 g/dL   HCT 60.8 60.9 - 47.9 %   MCV 84.6 80.0 - 100.0 fL   MCH 27.5 26.0 - 34.0 pg   MCHC 32.5 30.0 - 36.0 g/dL   RDW 86.2 88.4 - 84.4 %   Platelets 356 150 - 400 K/uL   nRBC 0.0 0.0 - 0.2 %    Comment: Performed at [facility], [address] [place], [place] [address]  Comprehensive metabolic panel     Status: Abnormal    Collection Time: 10/16/23  6:47 AM  Result Value Ref Range   Sodium 140 135 - 145 mmol/L   Potassium 3.6 3.5 - 5.1 mmol/L   Chloride 102 98 - 111 mmol/L   CO2 25 22 - 32 mmol/L   Glucose, Bld 111 (H) 70 - 99 mg/dL    Comment: Glucose reference range applies only to samples taken after fasting for at least 8 hours.   BUN 11 6 - 20 mg/dL   Creatinine, Ser 9.24 0.61 - 1.24 mg/dL   Calcium 9.5 8.9 - 89.6 mg/dL   Total Protein 7.3 6.5 - 8.1 g/dL   Albumin 3.8 3.5 - 5.0 g/dL   AST 22 15 - 41 U/L   ALT 39 0 - 44 U/L   Alkaline Phosphatase 53 38 - 126 U/L   Total Bilirubin 0.4 0.0 - 1.2 mg/dL   GFR, Estimated >39 >39 mL/min    Comment: (NOTE) Calculated using the CKD-EPI Creatinine Equation (2021)    Anion gap 13 5 - 15    Comment: Performed at [facility], [address], [place], [place] [address]    Blood Alcohol level:  Lab Results  Component Value Date   [facility] <15 10/05/2023   ETH <15 10/02/2023    Metabolic Disorder Labs: No results found for: HGBA1C, MPG No results found for: PROLACTIN Lab Results  Component Value Date   TRIG 257 (H) 05/15/2015    Physical Findings: AIMS:  , ,  ,  ,    CIWA:    COWS:      Psychiatric Specialty Exam:  Presentation  General Appearance:  Casual  Eye Contact: Fair  Speech: Clear and Coherent  Speech Volume: Normal    Mood and Affect  Mood: Euthymic  Affect: Congruent   Thought Process  Thought Processes: Goal Directed; Coherent  Descriptions of Associations:Intact  Orientation:Full (Time, Place and Person)  Thought Content:WDL  Hallucinations:No data recorded Ideas of Reference:None  Suicidal Thoughts:No data recorded Homicidal Thoughts:No data recorded  Sensorium  Memory: Immediate Fair  Judgment: Poor  Insight: Shallow   Executive Functions  Concentration: Fair  Attention Span: Fair  Recall: Fair  Fund of Knowledge: Fair  Language: Fair   Psychomotor  Activity  Psychomotor Activity:No data recorded Musculoskeletal: Strength & Muscle Tone: within normal limits Gait & Station: normal Assets  Assets: Manufacturing systems engineer; Desire for Improvement    Physical Exam: Physical Exam Vitals and nursing note reviewed.  HENT:     Head: Atraumatic.  Eyes:     Extraocular Movements: Extraocular movements intact.  Pulmonary:     Effort: Pulmonary effort is normal.  Neurological:     Mental Status: He is alert and oriented to person, place, and time.    Review of Systems  Psychiatric/Behavioral:  Positive for depression. Negative for hallucinations and suicidal ideas. The patient is nervous/anxious. The patient does not have insomnia.    Blood pressure (!) 155/111, pulse 99, temperature 97.9 F (36.6 C), resp. rate 18, height 6' 5 (1.956 m), weight 115.2 kg, SpO2 98%. Body mass index is 30.12 kg/m.  Diagnosis: Principal Problem:   Substance induced mood disorder (HCC)   PLAN: Safety and Monitoring:  -- Voluntary admission to inpatient psychiatric unit for safety, stabilization and treatment  -- Daily contact with patient to assess and evaluate symptoms and progress in treatment  -- Patient's case to be discussed in multi-disciplinary team meeting  -- Observation Level : q15 minute checks  -- Vital signs:  q12 hours  -- Precautions: suicide,  elopement, and assault -- Encouraged patient to participate in unit milieu and in scheduled group therapies  2. Psychiatric Diagnoses and Treatment:  Substance-induced mood disorder Continue Zyprexa  to 10 mg nightly for sleep and mood stabilization Continue  Prozac  10 mg daily for anxiety and depression       -- The risks/benefits/side-effects/alternatives to this medication were discussed in detail with the patient and time was given for questions. The patient consents to medication trial.                -- Metabolic profile and EKG monitoring obtained while on an atypical antipsychotic  (BMI: Lipid Panel: HbgA1c: QTc:)              -- Encouraged patient to participate in unit milieu and in scheduled group therapies                            3. Medical Issues Being Addressed:  Cleared following medical admission no acute concerns     4. Discharge Planning:   -- Social work and case management to assist with discharge planning and identification of hospital follow-up needs prior to discharge  -- Estimated LOS: 3-4 days  Donnice FORBES Right, PA-C 10/17/2023, 9:34 AM

## 2023-10-17 NOTE — Plan of Care (Signed)
  Problem: Education: Goal: Mental status will improve Outcome: Progressing   Problem: Activity: Goal: Sleeping patterns will improve Outcome: Progressing   Problem: Health Behavior/Discharge Planning: Goal: Compliance with treatment plan for underlying cause of condition will improve Outcome: Progressing   Problem: Safety: Goal: Periods of time without injury will increase Outcome: Progressing

## 2023-10-17 NOTE — Group Note (Signed)
 LCSW Group Therapy Note  Group Date: 10/17/2023 Start Time: 1248 End Time: 1347   Type of Therapy and Topic:  Group Therapy: Positive Affirmations  Participation Level:  Did Not Attend   Description of Group:   This group addressed positive affirmation towards self and others.  Patients went around the room and identified two positive things about themselves and two positive things about a peer in the room.  Patients reflected on how it felt to share something positive with others, to identify positive things about themselves, and to hear positive things from others/ Patients were encouraged to have a daily reflection of positive characteristics or circumstances.   Therapeutic Goals: Patients will verbalize two of their positive qualities Patients will demonstrate empathy for others by stating two positive qualities about a peer in the group Patients will verbalize their feelings when voicing positive self affirmations and when voicing positive affirmations of others Patients will discuss the potential positive impact on their wellness/recovery of focusing on positive traits of self and others.  Summary of Patient Progress:  Patient did not attend.   Therapeutic Modalities:   Cognitive Behavioral Therapy Motivational Interviewing    Russell Kennedy, LCSWA 10/17/2023  1:50 PM

## 2023-10-17 NOTE — BHH Counselor (Signed)
 CSW contacted [facility], [place], [phone].  CSW inquired about medications and was informed that the patient can not take psychotropic medications while in their facility.  CSW contacted the patient's GAL Apolinar Lunch, [phone].  CSW obtained permission to speak to the GAL.  GAL inquired about pt being psychotic.  CSW explained that that this time CSW is unaware of pt being psychotic.  CSW was also asked if patient can make his own decisions and CSW informed that provider makes that determination.   GAL asked to speak with the nurse and CSW passed the phone to Zaira RN.  Sherryle Margo, MSW, LCSW 10/17/2023 3:04 PM

## 2023-10-18 DIAGNOSIS — R000 Tachycardia, unspecified: Secondary | ICD-10-CM | POA: Diagnosis not present

## 2023-10-18 DIAGNOSIS — F1994 Other psychoactive substance use, unspecified with psychoactive substance-induced mood disorder: Secondary | ICD-10-CM | POA: Diagnosis not present

## 2023-10-18 MED ORDER — TRAZODONE HCL 100 MG PO TABS
100.0000 mg | ORAL_TABLET | Freq: Every evening | ORAL | Status: DC | PRN
  Administered 2023-10-18 – 2023-10-20 (×3): 100 mg via ORAL
  Filled 2023-10-18 (×3): qty 1

## 2023-10-18 MED ORDER — DILTIAZEM HCL ER COATED BEADS 120 MG PO CP24
120.0000 mg | ORAL_CAPSULE | Freq: Every day | ORAL | Status: DC
  Administered 2023-10-18 – 2023-10-19 (×2): 120 mg via ORAL
  Filled 2023-10-18 (×3): qty 1

## 2023-10-18 MED ORDER — NICOTINE POLACRILEX 2 MG MT GUM
2.0000 mg | CHEWING_GUM | OROMUCOSAL | Status: DC | PRN
  Administered 2023-10-18 – 2023-10-20 (×4): 2 mg via ORAL
  Filled 2023-10-18 (×4): qty 1

## 2023-10-18 NOTE — Group Note (Signed)
 LCSW Group Therapy Note  Group Date: 10/18/2023 Start Time: 1315 End Time: 1400   Type of Therapy and Topic:  Group Therapy: Anger Cues and Responses  Participation Level:  Minimal   Description of Group:   In this group, patients learned how to recognize the physical, cognitive, emotional, and behavioral responses they have to anger-provoking situations.  They identified a recent time they became angry and how they reacted.  They analyzed how their reaction was possibly beneficial and how it was possibly unhelpful.  The group discussed a variety of healthier coping skills that could help with such a situation in the future.  Focus was placed on how helpful it is to recognize the underlying emotions to our anger, because working on those can lead to a more permanent solution as well as our ability to focus on the important rather than the urgent.  Therapeutic Goals: Patients will remember their last incident of anger and how they felt emotionally and physically, what their thoughts were at the time, and how they behaved. Patients will identify how their behavior at that time worked for them, as well as how it worked against them. Patients will explore possible new behaviors to use in future anger situations. Patients will learn that anger itself is normal and cannot be eliminated, and that healthier reactions can assist with resolving conflict rather than worsening situations.  Summary of Patient Progress:   He was active during the group. He shared a recent occurrence wherein feeling pride led to anger. He demonstrated poor insight into the subject matter, was respectful of peers, and participated throughout the entire session.  Therapeutic Modalities:   Cognitive Behavioral Therapy    Sherryle JINNY Margo, LCSW 10/18/2023  2:52 PM

## 2023-10-18 NOTE — Progress Notes (Signed)
 [facility] MD Progress Note  10/18/2023 10:25 AM Russell Kennedy  MRN:  [id number]   Subjective:  Chart reviewed, case discussed in multidisciplinary meeting, patient seen during rounds.   9/25: Patient seen for follow up. They are alert and oriented. They are pleasant and cooperative on exam. Rate depression 0/10 and anxiety 1/10. Discussed coping skills and they indicate music helps them handle stress. Discuss support system and they identify their mother and girlfriend. They are linear, logical, and future oriented. They deny SI/HI/AVH. Discussed that they are looking at a rehab center in [place] because the [place]  facility will not allow them to take psychotropic medications. They discussed the plan to go to the center in [place] with their mother, and we are planning a family meeting to further discuss. Encouraged them to start participating in group and the unit milieu. They are performing Adls, and are having regular bowel movements.   9/24: Patient seen for follow up, they are found laying in bed. They are alert and oriented on exam. Notes that they got anxious last night due to having difficulty getting comfortable and received prn zyprexa . They report they were able to sleep well last night and that has significantly helped with their anxiety. They rate depression at 3/10 with primary sx being fatigue. They rate anxiety at 1/10. They discuss their mother and girlfriend as their support system. They deny si/hi/avh. They are linear on exam. They are future oriented continuing to discuss their plan to go to treatment. Encouraged them to start participating in group and the unit milieu. They are performing Adls, and are having regular bowel movements.     Sleep: improving  Appetite:  Good  Past Psychiatric History: see h&P Family History: History reviewed. No pertinent family history. Social History:  Social History   Substance and Sexual Activity  Alcohol Use Not Currently     Social History    Substance and Sexual Activity  Drug Use Yes   Comment: heroin, fentanyl     Social History   Socioeconomic History   Marital status: Single    Spouse name: Not on file   Number of children: Not on file   Years of education: Not on file   Highest education level: Not on file  Occupational History   Not on file  Tobacco Use   Smoking status: Every Day    Types: Cigarettes   Smokeless tobacco: Never  Vaping Use   Vaping status: Never Used  Substance and Sexual Activity   Alcohol use: Not Currently   Drug use: Yes    Comment: heroin, fentanyl    Sexual activity: Yes  Other Topics Concern   Not on file  Social History Narrative   Not on file   Social Drivers of Health   Financial Resource Strain: Not on file  Food Insecurity: No Food Insecurity (10/15/2023)   Hunger Vital Sign    Worried About Running Out of Food in the Last Year: Never true    Ran Out of Food in the Last Year: Never true  Transportation Needs: No Transportation Needs (10/15/2023)   PRAPARE - Transportation    Lack of Transportation (Medical): No    Lack of Transportation (Non-Medical): No  Physical Activity: Not on file  Stress: Not on file  Social Connections: Not on file   Past Medical History:  Past Medical History:  Diagnosis Date   Polysubstance abuse [facility])     Past Surgical History:  Procedure Laterality Date   ROTATOR CUFF REPAIR  Current Medications: Current Facility-Administered Medications  Medication Dose Route Frequency Provider Last Rate Last Admin   acetaminophen  (TYLENOL ) tablet 650 mg  650 mg Oral Q6H PRN Smith, Annie B, NP   650 mg at 10/18/23 0824   alum & mag hydroxide-simeth (MAALOX/MYLANTA) 200-200-20 MG/5ML suspension 30 mL  30 mL Oral Q4H PRN Smith, Annie B, NP       amLODipine  (NORVASC ) tablet 10 mg  10 mg Oral Daily Smith, Annie B, NP   10 mg at 10/18/23 0825   haloperidol  (HALDOL ) tablet 5 mg  5 mg Oral TID PRN McLauchlin, Angela, NP       And   diphenhydrAMINE   (BENADRYL ) capsule 50 mg  50 mg Oral TID PRN McLauchlin, Angela, NP   50 mg at 10/16/23 2104   haloperidol  lactate (HALDOL ) injection 5 mg  5 mg Intramuscular TID PRN McLauchlin, Jon, NP       And   diphenhydrAMINE  (BENADRYL ) injection 50 mg  50 mg Intramuscular TID PRN McLauchlin, Jon, NP       And   LORazepam  (ATIVAN ) injection 2 mg  2 mg Intramuscular TID PRN McLauchlin, Angela, NP   2 mg at 10/17/23 0112   haloperidol  lactate (HALDOL ) injection 10 mg  10 mg Intramuscular TID PRN McLauchlin, Jon, NP       And   diphenhydrAMINE  (BENADRYL ) injection 50 mg  50 mg Intramuscular TID PRN McLauchlin, Jon, NP       And   LORazepam  (ATIVAN ) injection 2 mg  2 mg Intramuscular TID PRN McLauchlin, Angela, NP       FLUoxetine  (PROZAC ) capsule 10 mg  10 mg Oral Daily Jerri Hargadon E, PA-C   10 mg at 10/18/23 0825   hydrOXYzine  (ATARAX ) tablet 50 mg  50 mg Oral TID PRN Maverick Patman E, PA-C   50 mg at 10/18/23 0111   labetalol  (NORMODYNE ) tablet 300 mg  300 mg Oral TID Smith, Annie B, NP   300 mg at 10/18/23 0825   lisinopril  (ZESTRIL ) tablet 10 mg  10 mg Oral Daily Smith, Annie B, NP   10 mg at 10/18/23 0825   magnesium  hydroxide (MILK OF MAGNESIA) suspension 30 mL  30 mL Oral Daily PRN Smith, Annie B, NP       nicotine  (NICODERM CQ  - dosed in mg/24 hours) patch 21 mg  21 mg Transdermal Daily Karalina Tift E, PA-C   21 mg at 10/18/23 9171   OLANZapine  (ZYPREXA ) injection 10 mg  10 mg Intramuscular TID PRN McLauchlin, Angela, NP       OLANZapine  (ZYPREXA ) tablet 10 mg  10 mg Oral QHS Keslyn Teater E, PA-C   10 mg at 10/17/23 2105   OLANZapine  zydis (ZYPREXA ) disintegrating tablet 5 mg  5 mg Oral TID PRN Smith, Annie B, NP   5 mg at 10/16/23 1101   traZODone  (DESYREL ) tablet 100 mg  100 mg Oral QHS PRN Angelize Ryce E, PA-C        Lab Results:  No results found for this or any previous visit (from the past 48 hours).   Blood Alcohol level:  Lab Results   Component Value Date   [facility] <15 10/05/2023   ETH <15 10/02/2023    Metabolic Disorder Labs: No results found for: HGBA1C, MPG No results found for: PROLACTIN Lab Results  Component Value Date   TRIG 257 (H) 05/15/2015    Physical Findings: AIMS:  , ,  ,  ,    CIWA:  COWS:      Psychiatric Specialty Exam:  Presentation  General Appearance:  Casual  Eye Contact: Fair  Speech: Clear and Coherent  Speech Volume: Normal    Mood and Affect  Mood: Euthymic  Affect: Congruent   Thought Process  Thought Processes: Goal Directed; Coherent  Descriptions of Associations:Intact  Orientation:Full (Time, Place and Person)  Thought Content:WDL  Hallucinations:No data recorded Ideas of Reference:None  Suicidal Thoughts:No data recorded Homicidal Thoughts:No data recorded  Sensorium  Memory: Immediate Fair  Judgment: Poor  Insight: Shallow   Executive Functions  Concentration: Fair  Attention Span: Fair  Recall: Fair  Fund of Knowledge: Fair  Language: Fair   Psychomotor Activity  Psychomotor Activity:No data recorded Musculoskeletal: Strength & Muscle Tone: within normal limits Gait & Station: normal Assets  Assets: Manufacturing systems engineer; Desire for Improvement    Physical Exam: Physical Exam Vitals and nursing note reviewed.  HENT:     Head: Atraumatic.  Eyes:     Extraocular Movements: Extraocular movements intact.  Pulmonary:     Effort: Pulmonary effort is normal.  Neurological:     Mental Status: He is alert and oriented to person, place, and time.    Review of Systems  Psychiatric/Behavioral:  Negative for depression, hallucinations, substance abuse and suicidal ideas. The patient is nervous/anxious. The patient does not have insomnia.    Blood pressure (!) 138/97, pulse (!) 119, temperature 97.9 F (36.6 C), resp. rate 18, height 6' 5 (1.956 m), weight 115.2 kg, SpO2 99%. Body mass index is 30.12  kg/m.  Diagnosis: Principal Problem:   Substance induced mood disorder (HCC)   PLAN: Safety and Monitoring:  -- Voluntary admission to inpatient psychiatric unit for safety, stabilization and treatment  -- Daily contact with patient to assess and evaluate symptoms and progress in treatment  -- Patient's case to be discussed in multi-disciplinary team meeting  -- Observation Level : q15 minute checks  -- Vital signs:  q12 hours  -- Precautions: suicide, elopement, and assault -- Encouraged patient to participate in unit milieu and in scheduled group therapies  2. Psychiatric Diagnoses and Treatment:  Substance-induced mood disorder Continue Zyprexa  to 10 mg nightly for sleep and mood stabilization Continue  Prozac  10 mg daily for anxiety and depression       -- The risks/benefits/side-effects/alternatives to this medication were discussed in detail with the patient and time was given for questions. The patient consents to medication trial.                -- Metabolic profile and EKG monitoring obtained while on an atypical antipsychotic (BMI: Lipid Panel: HbgA1c: QTc:)              -- Encouraged patient to participate in unit milieu and in scheduled group therapies                            3. Medical Issues Being Addressed:  Cleared following medical admission no acute concerns     4. Discharge Planning:   -- Social work and case management to assist with discharge planning and identification of hospital follow-up needs prior to discharge  -- Estimated LOS: 3-4 days  Donnice FORBES Right, PA-C 10/18/2023, 10:25 AM

## 2023-10-18 NOTE — Progress Notes (Signed)
 Pt calm and pleasant during assessment denying SI/HI/AVH. Pt endorses anxiety over not being able to get good sleep. Pt observed interacting appropriate with staff and peers on the unit. Pt compliant with medication administration per MD orders. Pt given education, support, and encouragement to be active in his treatment plan. Pt being monitored Q 15 minutes for safety per unit protocol, remains safe on the unit

## 2023-10-18 NOTE — Plan of Care (Signed)
  Problem: Education: Goal: Emotional status will improve Outcome: Progressing   Problem: Education: Goal: Mental status will improve Outcome: Progressing   Problem: Activity: Goal: Interest or engagement in activities will improve Outcome: Progressing   Problem: Coping: Goal: Ability to verbalize frustrations and anger appropriately will improve Outcome: Progressing   Problem: Coping: Goal: Ability to demonstrate self-control will improve Outcome: Progressing   Problem: Safety: Goal: Periods of time without injury will increase Outcome: Progressing   Problem: Clinical Measurements: Goal: Will remain free from infection Outcome: Progressing   Problem: Nutrition: Goal: Adequate nutrition will be maintained Outcome: Progressing   Problem: Elimination: Goal: Will not experience complications related to bowel motility Outcome: Progressing   Problem: Safety: Goal: Ability to remain free from injury will improve Outcome: Progressing

## 2023-10-18 NOTE — Plan of Care (Signed)
   Problem: Education: Goal: Knowledge of Russell Kennedy General Education information/materials will improve Outcome: Progressing Goal: Emotional status will improve Outcome: Progressing Goal: Mental status will improve Outcome: Progressing Goal: Verbalization of understanding the information provided will improve Outcome: Progressing   Problem: Activity: Goal: Interest or engagement in activities will improve Outcome: Progressing Goal: Sleeping patterns will improve Outcome: Progressing   Problem: Coping: Goal: Ability to verbalize frustrations and anger appropriately will improve Outcome: Progressing Goal: Ability to demonstrate self-control will improve Outcome: Progressing

## 2023-10-18 NOTE — BHH Counselor (Signed)
 CSW sat in on family meeting with the patient and family.  Meeting attendees were: Matthew,PA; pt, pt's mother, Leita via phone; and this Clinical research associate  It was discussed concerns for patient pursuing treatment at the faith based program in [place] .  Concern was because the program requested that patient not take any psychotropic medications.   Family reports that they are interested in patient going to the [facility] [place] in [place] .  CSW reports that she will follow up and then follow up with patient and mother.  Following meeting CSW contacted the facility in [place] .  CSW was informed that prior to current admission patient had been approved.  However, notes would need to be reviewed to determine appropriate following current admission.   CSW team sent the facesheet, meds, labs, H&P and last tree notes to the facility for review.  CSW is awaiting a return call.  Sherryle Margo, MSW, LCSW 10/18/2023 3:28 PM

## 2023-10-18 NOTE — Progress Notes (Signed)
   10/18/23 1800  Psych Admission Type (Psych Patients Only)  Admission Status Involuntary  Psychosocial Assessment  Patient Complaints Anxiety  Eye Contact Fair  Facial Expression Anxious  Affect Anxious  Speech Logical/coherent  Interaction Assertive  Motor Activity Slow  Appearance/Hygiene Unremarkable  Behavior Characteristics Cooperative  Mood Irritable  Aggressive Behavior  Effect No apparent injury  Thought Process  Coherency WDL  Content WDL  Delusions None reported or observed  Perception WDL  Hallucination None reported or observed  Judgment Poor  Confusion None  Danger to Self  Current suicidal ideation? Denies  Danger to Others  Danger to Others None reported or observed   Russell Kennedy expressed anxiety today related to what he believes is withdrawals from substance use. Presented as hypertensive and tachycardic through the shift--medications adjusted and provider notified. EKG performed. PRN hydroxyzine  given for elevated anxiety to mild effect.

## 2023-10-18 NOTE — Group Note (Signed)
 Recreation Therapy Group Note   Group Topic:Relaxation  Group Date: 10/18/2023 Start Time: 1530 End Time: 1610 Facilitators: Celestia Jeoffrey BRAVO, LRT, CTRS Location: Dayroom  Group Description: PMR (Progressive Muscle Relaxation). LRT educates patients on what PMR is and the benefits that come from it. Patients are asked to sit with their feet flat on the floor while sitting up and all the way back in their chair, if possible. LRT and pts follow a prompt through a speaker that requires you to tense and release different muscles in their body and focus on their breathing. During session, lights are off and soft music is being played. Pts are given a stress ball to use if needed.   Goal Area(s) Addressed:  Patients will be able to describe progressive muscle relaxation.  Patient will practice using relaxation technique. Patient will identify a new coping skill.  Patient will follow multistep directions to reduce anxiety and stress.   Affect/Mood: Appropriate   Participation Level: Active and Engaged   Participation Quality: Independent   Behavior: Appropriate, Calm, and Cooperative   Speech/Thought Process: Coherent   Insight: Good   Judgement: Good   Modes of Intervention: Education and Exploration   Patient Response to Interventions:  Attentive, Engaged, Interested , and Receptive   Education Outcome:  Acknowledges education   Clinical Observations/Individualized Feedback: Russell Kennedy was active in their participation of session activities and group discussion. Pt completed all exercises as prompted.    Plan: Continue to engage patient in RT group sessions 2-3x/week.   Jeoffrey BRAVO Celestia, LRT, CTRS 10/18/2023 5:14 PM

## 2023-10-18 NOTE — Consult Note (Signed)
 Initial Consultation Note   Patient: Russell Kennedy FMW:[id number] DOB: 18-Jun-1990 PCP: Patient, No Pcp Per DOA: 10/15/2023 DOS: the patient was seen and examined on 10/18/2023 Primary service: Donnelly Mellow, MD  Referring physician: Donnelly Mellow, MD/[place], Russell Kennedy Reason for consult: Tachycardia  Assessment/Plan: Assessment and Plan:   Russell Kennedy is a 33 y.o. male with medical history significant for Polysubstance abuse including opioid use disorder as well as alcohol use disorder who was under medical service 9/12-9/22 for withdrawal and was IVCed. He was DCed/transferred to [facility] on 9/23. We're consulted for Tachycardia.  Assessment and Plan:   Sinus Tach (100-120s)  Likely due to acute drug withdrawal # Acute alcohol withdrawal with delirium/hallucinations # Polysubstance abuse # Alcohol use disorder Withdrawal mgmt per psych  I will stop Norvasc  and Diltiazem  CD 120 mg daily. Will check routine labs in am - HR under control with above change  Substance-induced mood disorder Continue Zyprexa  & Prozac  per psych     TRH will continue to follow the patient.  HPI: Russell Kennedy is a 33 y.o. male with past medical history of Polysubstance use including opioid use disorder and alcohol use disorder initially admitted under medical service 9/12-9/22 for withdrawal and was IVCed. He was DCed/transferred to [facility] on 9/23. He had aggressive behavior, hallucinations and was placed under IVC for concern of self-harm. He is managed by [facility] providers and we're consulted today for Tachycardia. He reports unable to sleep and being restless.  Review of Systems: As mentioned in the history of present illness. All other systems reviewed and are negative. Past Medical History:  Diagnosis Date   Polysubstance abuse [facility])    Past Surgical History:  Procedure Laterality Date   ROTATOR CUFF REPAIR     Social History:  reports that he has been smoking cigarettes. He has never used  smokeless tobacco. He reports that he does not currently use alcohol. He reports current drug use.   Not on File  History reviewed. No pertinent family history.  Prior to Admission medications   Medication Sig Start Date End Date Taking? Authorizing Provider  acetaminophen  (TYLENOL ) 325 MG tablet Take 2 tablets (650 mg total) by mouth every 6 (six) hours as needed for mild pain (pain score 1-3) or fever (or Fever >/= 101). 10/15/23   Lenon Marien CROME, MD  amLODipine  (NORVASC ) 10 MG tablet Take 1 tablet (10 mg total) by mouth daily. 10/16/23   Lenon Marien CROME, MD  cloNIDine  (CATAPRES  - DOSED IN MG/24 HR) 0.2 mg/24hr patch Place 1 patch (0.2 mg total) onto the skin once a week. 10/22/23   Lenon Marien CROME, MD  labetalol  (NORMODYNE ) 300 MG tablet Take 1 tablet (300 mg total) by mouth 3 (three) times daily. 10/15/23   Lenon Marien CROME, MD  lisinopril  (ZESTRIL ) 10 MG tablet Take 1 tablet (10 mg total) by mouth daily. 10/16/23   Lenon Marien CROME, MD  OLANZapine  (ZYPREXA ) 5 MG tablet Take 1 tablet (5 mg total) by mouth at bedtime. 10/15/23   Lenon Marien CROME, MD    Physical Exam: Vitals:   10/18/23 0825 10/18/23 1231 10/18/23 1612 10/18/23 1713  BP: (!) 138/97 133/88 (!) 136/99 (!) 136/99  Pulse: (!) 119 (!) 114 96 96  Resp:  18    Temp:      TempSrc:      SpO2:  100% 100%   Weight:      Height:       General: Pt is alert, awake, sitting in the  chair Cardiovascular: RRR, S1/S2 +, no rubs, no gallops Respiratory: CTA bilaterally, no wheezing, no rhonchi Abdominal: Soft, NT, ND, bowel sounds + Extremities: no edema, no cyanosis Psych: Anxious, tremulous  Data Reviewed:   EKG shows Sinus tach    Family Communication: none at bedside Primary team communication: d/w Russell Kennedy. Thank you very much for involving us  in the care of your patient.  Author: Cresencio Fairly, MD 10/18/2023 8:53 PM  For on call review [email or web address].

## 2023-10-18 NOTE — Group Note (Signed)
 Recreation Therapy Group Note   Group Topic:General Recreation  Group Date: 10/18/2023 Start Time: 1000 End Time: 1100 Facilitators: Celestia Jeoffrey BRAVO, LRT, CTRS Location: Courtyard  Group Description: [employer]. LRT and patients played games of basketball, drew with chalk, and played corn hole while outside in the courtyard while getting fresh air and sunlight. Music was being played in the background. LRT and peers conversed about different games they have played before, what they do in their free time and anything else that is on their minds. LRT encouraged pts to drink water after being outside, sweating and getting their heart rate up.  Goal Area(s) Addressed: Patient will build on frustration tolerance skills. Patients will partake in a competitive play game with peers. Patients will gain knowledge of new leisure interest/hobby.     Affect/Mood: Appropriate   Participation Level: Active   Participation Quality: Independent   Behavior: Appropriate   Speech/Thought Process: Coherent   Insight: Good   Judgement: Good   Modes of Intervention: Activity   Patient Response to Interventions:  Receptive   Education Outcome:  Acknowledges education   Clinical Observations/Individualized Feedback: Thornton was active in their participation of session activities and group discussion. Pt interacted well with LRT and peers duration of session.    Plan: Continue to engage patient in RT group sessions 2-3x/week.   Jeoffrey BRAVO Celestia, LRT, CTRS 10/18/2023 11:32 AM

## 2023-10-18 NOTE — Progress Notes (Signed)
 Pt's pleasant, visible in the milieu and noted interacting with peers. Reports anxiety and depression both rated mild.  When asked what his goal for treatment, Pt states, To get rest.  At about 0108 pt lying in bed screaming I need help.  Reports difficult sleeping; Atarax  given.   10/17/23 2036  Psych Admission Type (Psych Patients Only)  Admission Status Involuntary  Psychosocial Assessment  Patient Complaints Anxiety  Eye Contact Fair  Facial Expression Anxious  Affect Anxious  Speech Logical/coherent  Interaction Assertive  Motor Activity Slow  Appearance/Hygiene Disheveled  Behavior Characteristics Cooperative  Mood Labile  Thought Process  Coherency WDL  Content WDL  Delusions None reported or observed  Perception WDL  Hallucination None reported or observed  Judgment Poor  Confusion None  Danger to Self  Current suicidal ideation? Denies  Danger to Others  Danger to Others None reported or observed

## 2023-10-18 NOTE — Group Note (Signed)
 Date:  10/18/2023 Time:  6:47 PM  Group Topic/Focus:  Wellness Toolbox:   The focus of this group is to discuss various aspects of wellness, balancing those aspects and exploring ways to increase the ability to experience wellness.  Patients will create a wellness toolbox for use upon discharge.    Participation Level:  Active  Participation Quality:  Appropriate  Affect:  Appropriate  Cognitive:  Appropriate  Insight: Appropriate  Engagement in Group:  Engaged  Modes of Intervention:  Activity and Socialization  Additional Comments:    Russell Kennedy 10/18/2023, 6:47 PM

## 2023-10-19 DIAGNOSIS — F1994 Other psychoactive substance use, unspecified with psychoactive substance-induced mood disorder: Secondary | ICD-10-CM | POA: Diagnosis not present

## 2023-10-19 DIAGNOSIS — F1011 Alcohol abuse, in remission: Secondary | ICD-10-CM | POA: Diagnosis not present

## 2023-10-19 DIAGNOSIS — R000 Tachycardia, unspecified: Secondary | ICD-10-CM | POA: Diagnosis not present

## 2023-10-19 DIAGNOSIS — I10 Essential (primary) hypertension: Secondary | ICD-10-CM | POA: Diagnosis not present

## 2023-10-19 LAB — BASIC METABOLIC PANEL WITH GFR
Anion gap: 16 — ABNORMAL HIGH (ref 5–15)
BUN: 13 mg/dL (ref 6–20)
CO2: 22 mmol/L (ref 22–32)
Calcium: 9.3 mg/dL (ref 8.9–10.3)
Chloride: 103 mmol/L (ref 98–111)
Creatinine, Ser: 0.67 mg/dL (ref 0.61–1.24)
GFR, Estimated: >60 mL/min (ref >60)
Glucose, Bld: 110 mg/dL — ABNORMAL HIGH (ref 70–99)
Potassium: 3.7 mmol/L (ref 3.5–5.1)
Sodium: 141 mmol/L (ref 135–145)

## 2023-10-19 LAB — CBC
HCT: 34.6 % — ABNORMAL LOW (ref 39.0–52.0)
Hemoglobin: 11.5 g/dL — ABNORMAL LOW (ref 13.0–17.0)
MCH: 27.6 pg (ref 26.0–34.0)
MCHC: 33.2 g/dL (ref 30.0–36.0)
MCV: 83 fL (ref 80.0–100.0)
Platelets: 336 K/uL (ref 150–400)
RBC: 4.17 MIL/uL — ABNORMAL LOW (ref 4.22–5.81)
RDW: 13.4 % (ref 11.5–15.5)
WBC: 10.5 K/uL (ref 4.0–10.5)
nRBC: 0 % (ref 0.0–0.2)

## 2023-10-19 MED ORDER — LABETALOL HCL 200 MG PO TABS: 300.0000 mg | ORAL_TABLET | Freq: Two times a day (BID) | ORAL | Status: DC

## 2023-10-19 NOTE — Progress Notes (Signed)
  Progress Note   Patient: Russell Kennedy FMW:[id number] DOB: 03/07/1990 DOA: 10/15/2023     4 DOS: the patient was seen and examined on 10/19/2023   Brief hospital course: 33 y.o. male with medical history significant for Polysubstance abuse including opioid use disorder as well as alcohol use disorder who was under medical service 9/12-9/22 for withdrawal and was IVCed. He was DCed/transferred to [facility] on 9/23. We're consulted for Tachycardia.   9/26.  Patient feels a little shaky and a little fatigued.  He states he uses fentanyl  and methadone as outpatient.  Has been off pain meds for 9 days currently.  Case discussed with psychiatric team and next placed a plan on sending him to wants him off pain meds.  Assessment and Plan: * Sinus tachycardia Likely secondary to opioid withdrawal.  Patient on Cardizem  CD and will decrease dose of labetalol .  Essential hypertension Blood pressure trending a little lower.  Will decrease dose of labetalol  and discontinue lisinopril .  History of alcohol abuse .  Substance induced mood disorder (HCC) Patient currently on Prozac , trazodone  and Zyprexa .  As needed medications also ordered.  Treatment as per psychiatry team.  The next place that they planned on sending him once some of the pain medications so we will not restart any pain medications.        Subjective: Patient feeling little tired not sleeping very well.  He states he takes 105 mg of methadone and also fentanyl  that he snorts as outpatient.  Has been off pain medications during the hospital course.  Physical Exam: Vitals:   10/18/23 1612 10/18/23 1713 10/19/23 0603 10/19/23 1128  BP: (!) 136/99 (!) 136/99 (!) 140/92 109/72  Pulse: 96 96 86 (!) 108  Resp:   20   Temp:   (!) 97.4 F (36.3 C)   TempSrc:      SpO2: 100%  100%   Weight:      Height:       Physical Exam HENT:     Head: Normocephalic.     Mouth/Throat:     Pharynx: No oropharyngeal exudate.  Eyes:     General: Lids  are normal.     Conjunctiva/sclera: Conjunctivae normal.  Cardiovascular:     Rate and Rhythm: Regular rhythm. Tachycardia present.     Heart sounds: Normal heart sounds, S1 normal and S2 normal.  Pulmonary:     Breath sounds: No decreased breath sounds, wheezing, rhonchi or rales.  Abdominal:     Palpations: Abdomen is soft.     Tenderness: There is no abdominal tenderness.  Musculoskeletal:     Right lower leg: No swelling.     Left lower leg: No swelling.  Skin:    General: Skin is warm.     Findings: No rash.  Neurological:     Mental Status: He is alert.     Data Reviewed: White blood cell count 10.5, hemoglobin 11.5, platelet count 336, creatinine 0.67, electrolytes normal range  Family Communication: As per psychiatry team  Disposition: As per psychiatry team  Planned Discharge Destination: Psychiatry team working on next place to be    Time spent: 28 minutes  Author: Charlie Patterson, MD 10/19/2023 1:38 PM  For on call review [email or web address].

## 2023-10-19 NOTE — Group Note (Signed)
 Date:  10/19/2023 Time:  10:47 PM  Group Topic/Focus:  Emotional Education:   The focus of this group is to discuss what feelings/emotions are, and how they are experienced.    Participation Level:  Active  Participation Quality:  Appropriate  Affect:  Appropriate  Cognitive:  Appropriate  Insight: Appropriate  Engagement in Group:  Engaged  Modes of Intervention:  Discussion  Additional Comments:    Ashara Lounsbury L 10/19/2023, 10:47 PM

## 2023-10-19 NOTE — Group Note (Signed)
 Date:  10/19/2023 Time:  10:54 PM  Group Topic/Focus:  Making Healthy Choices:   The focus of this group is to help patients identify negative/unhealthy choices they were using prior to admission and identify positive/healthier coping strategies to replace them upon discharge.    Participation Level:  Active  Participation Quality:  Appropriate  Affect:  Appropriate  Cognitive:  Appropriate  Insight: Appropriate  Engagement in Group:  Engaged  Modes of Intervention:  Discussion and Education  Additional Comments:    Donn Wilmot L 10/19/2023, 10:54 PM

## 2023-10-19 NOTE — Group Note (Signed)
 Date:  10/19/2023 Time:  6:04 PM  Group Topic/Focus:  Wellness Toolbox:   The focus of this group is to discuss various aspects of wellness, balancing those aspects and exploring ways to increase the ability to experience wellness.  Patients will create a wellness toolbox for use upon discharge.    Participation Level:  Active  Participation Quality:  Appropriate  Affect:  Appropriate  Cognitive:  Appropriate  Insight: Appropriate  Engagement in Group:  Engaged  Modes of Intervention:  Activity and Socialization  Additional Comments:    Russell Kennedy 10/19/2023, 6:04 PM

## 2023-10-19 NOTE — Progress Notes (Signed)
   10/19/23 0819  Psych Admission Type (Psych Patients Only)  Admission Status Involuntary  Psychosocial Assessment  Patient Complaints None  Eye Contact Fair  Facial Expression Animated  Affect Appropriate to circumstance  Speech Logical/coherent  Interaction Assertive  Motor Activity Slow  Appearance/Hygiene Unremarkable  Behavior Characteristics Cooperative;Appropriate to situation  Mood Pleasant  Thought Process  Coherency WDL  Content WDL  Delusions None reported or observed  Perception WDL  Hallucination None reported or observed  Judgment Poor  Confusion None  Danger to Self  Current suicidal ideation? Denies  Agreement Not to Harm Self Yes  Description of Agreement Verbal  Danger to Others  Danger to Others None reported or observed

## 2023-10-19 NOTE — Assessment & Plan Note (Addendum)
 Likely secondary to opioid withdrawal.  Increase Cardizem  CD to 240 mg daily.  Continue labetalol  300 mg 3 times daily.  Heart rate improved this afternoon from this morning.  Continue to monitor.

## 2023-10-19 NOTE — Assessment & Plan Note (Signed)
 Russell Kennedy

## 2023-10-19 NOTE — Assessment & Plan Note (Addendum)
 Continue Cardizem  CD 240 mg daily, Labetalol  300 mg 3 times daily, and lisinopril  10 mg daily.

## 2023-10-19 NOTE — Group Note (Signed)
 Recreation Therapy Group Note   Group Topic:Leisure Education  Group Date: 10/19/2023 Start Time: 1300 End Time: 1400 Facilitators: Celestia Jeoffrey BRAVO, LRT, CTRS Location: Craft Room  Group Description: Leisure. Patients were given the option to choose from journaling, coloring, drawing, making origami, playing with playdoh, listening to music or singing karaoke. LRT and pts discussed the meaning of leisure, the importance of participating in leisure during their free time/when they're outside of the hospital, as well as how our leisure interests can also serve as coping skills.   Goal Area(s) Addressed:  Patient will identify a current leisure interest.  Patient will learn the definition of "leisure". Patient will practice making a positive decision. Patient will have the opportunity to try a new leisure activity. Patient will communicate with peers and LRT.    Affect/Mood: N/A   Participation Level: Did not attend    Clinical Observations/Individualized Feedback: Russell Kennedy came to group during the last 5 minutes. Pt shared that he did not feel well and that he was going to lay down. Pts face appeared to be red. RN notified.   Plan: Continue to engage patient in RT group sessions 2-3x/week.   Jeoffrey BRAVO Celestia, LRT, CTRS 10/19/2023 3:19 PM

## 2023-10-19 NOTE — BHH Counselor (Addendum)
 CSW spoke with the [facility] of [place], [phone].  Again CONFIRMED that patient has been ACCEPTED.    CSW inquired if pt can be admitted on Sunday.  They report that patient can be admitted by 4PM.    CSW inquired about medical staff on premises.  CSW was informed we have on call physicians and 23/7 nursing staff.  And we're also across the street from the hospital if needed.    CSW inquired about staff on the weekend to discuss care.  CSW was informed that staff will be present for 7AM to 5:30PM on the weekends.  Sherryle Margo, MSW, LCSW 10/19/2023 3:47 PM

## 2023-10-19 NOTE — Group Note (Signed)
 Recreation Therapy Group Note   Group Topic:Health and Wellness  Group Date: 10/19/2023 Start Time: 1020 End Time: 1115 Facilitators: Celestia Jeoffrey BRAVO, LRT, CTRS Location: Courtyard  Group Description: [employer]. LRT and patients played games of basketball, drew with chalk, and played corn hole while outside in the courtyard while getting fresh air and sunlight. Music was being played in the background. LRT and peers conversed about different games they have played before, what they do in their free time and anything else that is on their minds. LRT encouraged pts to drink water after being outside, sweating and getting their heart rate up.  Goal Area(s) Addressed: Patient will build on frustration tolerance skills. Patients will partake in a competitive play game with peers. Patients will gain knowledge of new leisure interest/hobby.     Affect/Mood: Appropriate   Participation Level: Active   Participation Quality: Independent   Behavior: Appropriate   Speech/Thought Process: Coherent   Insight: Good   Judgement: Good   Modes of Intervention: Activity   Patient Response to Interventions:  Receptive   Education Outcome:  Acknowledges education   Clinical Observations/Individualized Feedback: Russell Kennedy was active in their participation of session activities and group discussion. Pt interacted well with LRT and peers duration of session.    Plan: Continue to engage patient in RT group sessions 2-3x/week.   Jeoffrey BRAVO Celestia, LRT, CTRS 10/19/2023 11:45 AM

## 2023-10-19 NOTE — Group Note (Deleted)
 Date:  10/19/2023 Time:  10:40 PM  Group Topic/Focus:  Emotional Education:   The focus of this group is to discuss what feelings/emotions are, and how they are experienced.     Participation Level:  {BHH PARTICIPATION OZCZO:77735}  Participation Quality:  {BHH PARTICIPATION QUALITY:22265}  Affect:  {BHH AFFECT:22266}  Cognitive:  {BHH COGNITIVE:22267}  Insight: {BHH Insight2:20797}  Engagement in Group:  {BHH ENGAGEMENT IN HMNLE:77731}  Modes of Intervention:  {BHH MODES OF INTERVENTION:22269}  Additional Comments:  ***  Weiland Tomich L 10/19/2023, 10:40 PM

## 2023-10-19 NOTE — Hospital Course (Addendum)
 33 y.o. male with medical history significant for Polysubstance abuse including opioid use disorder as well as alcohol use disorder who was under medical service 9/12-9/22 for withdrawal and was IVCed. He was DCed/transferred to [facility] on 9/23. We're consulted for Tachycardia.   9/26.  Patient feels a little shaky and a little fatigued.  He states he uses fentanyl  and methadone as outpatient.  Has been off pain meds for 9 days currently.  Case discussed with psychiatric team and next placed a plan on sending him to wants him off pain meds. 9/27.  Cardizem  CD increased to 240 mg daily.  Labetalol  every 8 hours. 9/28.  Patient seen this morning.  Blood pressure high before morning meds but heart rate better.

## 2023-10-19 NOTE — Assessment & Plan Note (Signed)
 Patient currently on Prozac , trazodone  and Zyprexa .  As needed medications also ordered.  Treatment as per psychiatry team.  The next place that they planned on sending him once some of the pain medications so we will not restart any pain medications.

## 2023-10-19 NOTE — Plan of Care (Signed)
   Problem: Education: Goal: Emotional status will improve Outcome: Progressing Goal: Mental status will improve Outcome: Progressing Goal: Verbalization of understanding the information provided will improve Outcome: Progressing

## 2023-10-19 NOTE — BHH Counselor (Signed)
 CSW spoke with [facility] of [place].  They report that the patient has been ACCEPTED.   It is reported that the patient can arrive on Sunday or Saturday.  Sherryle Margo, MSW, LCSW 10/19/2023 1:52 PM

## 2023-10-19 NOTE — Progress Notes (Signed)
 [facility] MD Progress Note  10/19/2023 10:27 AM Russell Kennedy  MRN:  [id number]   Subjective:  Chart reviewed, case discussed in multidisciplinary meeting, patient seen during rounds.    9/26: Patient seen for follow up. They are alert and oriented. They are pleasant and cooperative on exam. Rate depression 0/10 and anxiety 1/10. They are future oriented. Noted that since starting the new medication yesterday they have not felt their heart racing which has helped their anxiety. Sleep was improved last night. They are performing ADLS.    9/25: Patient seen for follow up. They are alert and oriented. They are pleasant and cooperative on exam. Rate depression 0/10 and anxiety 1/10. Discussed coping skills and they indicate music helps them handle stress. Discuss support system and they identify their mother and girlfriend. They are linear, logical, and future oriented. They deny SI/HI/AVH. Discussed that they are looking at a rehab center in [place] because the [place]  facility will not allow them to take psychotropic medications. They discussed the plan to go to the center in [place] with their mother, and we are planning a family meeting to further discuss. Encouraged them to start participating in group and the unit milieu. They are performing Adls, and are having regular bowel movements.   9/24: Patient seen for follow up, they are found laying in bed. They are alert and oriented on exam. Notes that they got anxious last night due to having difficulty getting comfortable and received prn zyprexa . They report they were able to sleep well last night and that has significantly helped with their anxiety. They rate depression at 3/10 with primary sx being fatigue. They rate anxiety at 1/10. They discuss their mother and girlfriend as their support system. They deny si/hi/avh. They are linear on exam. They are future oriented continuing to discuss their plan to go to treatment. Encouraged them to start participating  in group and the unit milieu. They are performing Adls, and are having regular bowel movements.     Sleep: improving  Appetite:  Good  Past Psychiatric History: see h&P Family History: History reviewed. No pertinent family history. Social History:  Social History   Substance and Sexual Activity  Alcohol Use Not Currently     Social History   Substance and Sexual Activity  Drug Use Yes   Comment: heroin, fentanyl     Social History   Socioeconomic History   Marital status: Single    Spouse name: Not on file   Number of children: Not on file   Years of education: Not on file   Highest education level: Not on file  Occupational History   Not on file  Tobacco Use   Smoking status: Every Day    Types: Cigarettes   Smokeless tobacco: Never  Vaping Use   Vaping status: Never Used  Substance and Sexual Activity   Alcohol use: Not Currently   Drug use: Yes    Comment: heroin, fentanyl    Sexual activity: Yes  Other Topics Concern   Not on file  Social History Narrative   Not on file   Social Drivers of Health   Financial Resource Strain: Not on file  Food Insecurity: No Food Insecurity (10/15/2023)   Hunger Vital Sign    Worried About Running Out of Food in the Last Year: Never true    Ran Out of Food in the Last Year: Never true  Transportation Needs: No Transportation Needs (10/15/2023)   PRAPARE - Administrator, Civil Service (Medical):  No    Lack of Transportation (Non-Medical): No  Physical Activity: Not on file  Stress: Not on file  Social Connections: Not on file   Past Medical History:  Past Medical History:  Diagnosis Date   Polysubstance abuse (HCC)     Past Surgical History:  Procedure Laterality Date   ROTATOR CUFF REPAIR      Current Medications: Current Facility-Administered Medications  Medication Dose Route Frequency Provider Last Rate Last Admin   acetaminophen  (TYLENOL ) tablet 650 mg  650 mg Oral Q6H PRN Smith, Annie B, NP    650 mg at 10/18/23 1525   alum & mag hydroxide-simeth (MAALOX/MYLANTA) 200-200-20 MG/5ML suspension 30 mL  30 mL Oral Q4H PRN Smith, Annie B, NP       diltiazem  (CARDIZEM  CD) 24 hr capsule 120 mg  120 mg Oral Daily Maree Hue, MD   120 mg at 10/19/23 9180   haloperidol  (HALDOL ) tablet 5 mg  5 mg Oral TID PRN McLauchlin, Angela, NP       And   diphenhydrAMINE  (BENADRYL ) capsule 50 mg  50 mg Oral TID PRN McLauchlin, Angela, NP   50 mg at 10/18/23 2103   haloperidol  lactate (HALDOL ) injection 5 mg  5 mg Intramuscular TID PRN McLauchlin, Jon, NP       And   diphenhydrAMINE  (BENADRYL ) injection 50 mg  50 mg Intramuscular TID PRN McLauchlin, Jon, NP       And   LORazepam  (ATIVAN ) injection 2 mg  2 mg Intramuscular TID PRN McLauchlin, Angela, NP   2 mg at 10/17/23 0112   haloperidol  lactate (HALDOL ) injection 10 mg  10 mg Intramuscular TID PRN McLauchlin, Jon, NP       And   diphenhydrAMINE  (BENADRYL ) injection 50 mg  50 mg Intramuscular TID PRN McLauchlin, Angela, NP       And   LORazepam  (ATIVAN ) injection 2 mg  2 mg Intramuscular TID PRN McLauchlin, Angela, NP       FLUoxetine  (PROZAC ) capsule 10 mg  10 mg Oral Daily Kleigh Hoelzer E, PA-C   10 mg at 10/19/23 0818   hydrOXYzine  (ATARAX ) tablet 50 mg  50 mg Oral TID PRN Delcie Ruppert E, PA-C   50 mg at 10/19/23 1022   labetalol  (NORMODYNE ) tablet 300 mg  300 mg Oral TID Smith, Annie B, NP   300 mg at 10/19/23 0819   lisinopril  (ZESTRIL ) tablet 10 mg  10 mg Oral Daily Smith, Annie B, NP   10 mg at 10/19/23 0818   magnesium  hydroxide (MILK OF MAGNESIA) suspension 30 mL  30 mL Oral Daily PRN Smith, Annie B, NP       nicotine  (NICODERM CQ  - dosed in mg/24 hours) patch 21 mg  21 mg Transdermal Daily Brennden Masten E, PA-C   21 mg at 10/19/23 0818   nicotine  polacrilex (NICORETTE ) gum 2 mg  2 mg Oral PRN Hadiyah Maricle E, PA-C   2 mg at 10/19/23 1022   OLANZapine  (ZYPREXA ) injection 10 mg  10 mg Intramuscular TID PRN  McLauchlin, Angela, NP       OLANZapine  (ZYPREXA ) tablet 10 mg  10 mg Oral QHS Luisa Louk E, PA-C   10 mg at 10/18/23 2103   OLANZapine  zydis (ZYPREXA ) disintegrating tablet 5 mg  5 mg Oral TID PRN Smith, Annie B, NP   5 mg at 10/16/23 1101   traZODone  (DESYREL ) tablet 100 mg  100 mg Oral QHS PRN Danija Gosa E, PA-C   100 mg at  10/18/23 2103    Lab Results:  Results for orders placed or performed during the hospital encounter of 10/15/23 (from the past 48 hours)  CBC     Status: Abnormal   Collection Time: 10/19/23  6:55 AM  Result Value Ref Range   WBC 10.5 4.0 - 10.5 K/uL   RBC 4.17 (L) 4.22 - 5.81 MIL/uL   Hemoglobin 11.5 (L) 13.0 - 17.0 g/dL   HCT 65.3 (L) 60.9 - 47.9 %   MCV 83.0 80.0 - 100.0 fL   MCH 27.6 26.0 - 34.0 pg   MCHC 33.2 30.0 - 36.0 g/dL   RDW 86.5 88.4 - 84.4 %   Platelets 336 150 - 400 K/uL   nRBC 0.0 0.0 - 0.2 %    Comment: Performed at [facility], [address], [place], [place] [address]  Basic metabolic panel with GFR     Status: Abnormal   Collection Time: 10/19/23  6:55 AM  Result Value Ref Range   Sodium 141 135 - 145 mmol/L   Potassium 3.7 3.5 - 5.1 mmol/L   Chloride 103 98 - 111 mmol/L   CO2 22 22 - 32 mmol/L   Glucose, Bld 110 (H) 70 - 99 mg/dL    Comment: Glucose reference range applies only to samples taken after fasting for at least 8 hours.   BUN 13 6 - 20 mg/dL   Creatinine, Ser 9.32 0.61 - 1.24 mg/dL   Calcium 9.3 8.9 - 89.6 mg/dL   GFR, Estimated >39 >39 mL/min    Comment: (NOTE) Calculated using the CKD-EPI Creatinine Equation (2021)    Anion gap 16 (H) 5 - 15    Comment: Performed at [facility], [address] [place], [place] [address]     Blood Alcohol level:  Lab Results  Component Value Date   [facility] <15 10/05/2023   ETH <15 10/02/2023    Metabolic Disorder Labs: No results found for: HGBA1C, MPG No results found for: PROLACTIN Lab Results  Component Value Date   TRIG 257 (H)  05/15/2015    Physical Findings: AIMS:  , ,  ,  ,    CIWA:    COWS:      Psychiatric Specialty Exam:  Presentation  General Appearance:  Casual  Eye Contact: Fair  Speech: Clear and Coherent  Speech Volume: Normal    Mood and Affect  Mood: Euthymic  Affect: Congruent   Thought Process  Thought Processes: Goal Directed; Coherent  Descriptions of Associations:Intact  Orientation:Full (Time, Place and Person)  Thought Content:WDL  Hallucinations:No data recorded Ideas of Reference:None  Suicidal Thoughts:No data recorded Homicidal Thoughts:No data recorded  Sensorium  Memory: Immediate Fair  Judgment: Poor  Insight: Shallow   Executive Functions  Concentration: Fair  Attention Span: Fair  Recall: Fair  Fund of Knowledge: Fair  Language: Fair   Psychomotor Activity  Psychomotor Activity:No data recorded Musculoskeletal: Strength & Muscle Tone: within normal limits Gait & Station: normal Assets  Assets: Manufacturing systems engineer; Desire for Improvement    Physical Exam: Physical Exam Vitals and nursing note reviewed.  HENT:     Head: Atraumatic.  Eyes:     Extraocular Movements: Extraocular movements intact.  Pulmonary:     Effort: Pulmonary effort is normal.  Neurological:     Mental Status: He is alert and oriented to person, place, and time.    Review of Systems  Psychiatric/Behavioral:  Negative for depression, hallucinations, substance abuse and suicidal ideas. The patient is nervous/anxious.  The patient does not have insomnia.    Blood pressure (!) 140/92, pulse 86, temperature (!) 97.4 F (36.3 C), resp. rate 20, height 6' 5 (1.956 m), weight 115.2 kg, SpO2 100%. Body mass index is 30.12 kg/m.  Diagnosis: Principal Problem:   Substance induced mood disorder (HCC)   PLAN: Safety and Monitoring:  -- Voluntary admission to inpatient psychiatric unit for safety, stabilization and treatment  -- Daily contact  with patient to assess and evaluate symptoms and progress in treatment  -- Patient's case to be discussed in multi-disciplinary team meeting  -- Observation Level : q15 minute checks  -- Vital signs:  q12 hours  -- Precautions: suicide, elopement, and assault -- Encouraged patient to participate in unit milieu and in scheduled group therapies  2. Psychiatric Diagnoses and Treatment:  Substance-induced mood disorder Continue Zyprexa  to 10 mg nightly for sleep and mood stabilization Continue  Prozac  10 mg daily for anxiety and depression       -- The risks/benefits/side-effects/alternatives to this medication were discussed in detail with the patient and time was given for questions. The patient consents to medication trial.                -- Metabolic profile and EKG monitoring obtained while on an atypical antipsychotic (BMI: Lipid Panel: HbgA1c: QTc:)              -- Encouraged patient to participate in unit milieu and in scheduled group therapies                            3. Medical Issues Being Addressed:  Hospitalist consulted given on going tachycardia, appreciate their input and medication adjustment. Cardizem  has improved rate control rate this morning was in the 80s as opposed to 114 yesterday.      4. Discharge Planning:   -- Social work and case management to assist with discharge planning and identification of hospital follow-up needs prior to discharge  -- Estimated LOS: 3-4 days  Donnice FORBES Right, PA-C 10/19/2023, 10:27 AM

## 2023-10-19 NOTE — Plan of Care (Signed)
   Problem: Education: Goal: Emotional status will improve Outcome: Progressing Goal: Mental status will improve Outcome: Progressing

## 2023-10-20 DIAGNOSIS — Z8659 Personal history of other mental and behavioral disorders: Secondary | ICD-10-CM

## 2023-10-20 DIAGNOSIS — I10 Essential (primary) hypertension: Secondary | ICD-10-CM

## 2023-10-20 DIAGNOSIS — F1011 Alcohol abuse, in remission: Secondary | ICD-10-CM | POA: Diagnosis not present

## 2023-10-20 DIAGNOSIS — R000 Tachycardia, unspecified: Secondary | ICD-10-CM

## 2023-10-20 DIAGNOSIS — F1994 Other psychoactive substance use, unspecified with psychoactive substance-induced mood disorder: Secondary | ICD-10-CM | POA: Diagnosis not present

## 2023-10-20 MED ORDER — DILTIAZEM HCL ER COATED BEADS 240 MG PO CP24
240.0000 mg | ORAL_CAPSULE | Freq: Every day | ORAL | Status: DC
  Administered 2023-10-20 – 2023-10-21 (×2): 240 mg via ORAL
  Filled 2023-10-20 (×2): qty 1

## 2023-10-20 MED ORDER — LABETALOL HCL 200 MG PO TABS
300.0000 mg | ORAL_TABLET | Freq: Three times a day (TID) | ORAL | Status: DC
  Administered 2023-10-20: 300 mg via ORAL
  Filled 2023-10-20 (×2): qty 1

## 2023-10-20 MED ORDER — LABETALOL HCL 200 MG PO TABS
300.0000 mg | ORAL_TABLET | Freq: Three times a day (TID) | ORAL | Status: DC
  Administered 2023-10-20 – 2023-10-21 (×2): 300 mg via ORAL
  Filled 2023-10-20 (×3): qty 1

## 2023-10-20 MED ORDER — LISINOPRIL 5 MG PO TABS
10.0000 mg | ORAL_TABLET | Freq: Every day | ORAL | Status: DC
  Administered 2023-10-20 – 2023-10-21 (×2): 10 mg via ORAL
  Filled 2023-10-20 (×2): qty 2

## 2023-10-20 NOTE — Progress Notes (Signed)
   10/20/23 1300  Psych Admission Type (Psych Patients Only)  Admission Status Involuntary  Psychosocial Assessment  Patient Complaints Substance abuse (States that he has cravings)  Eye Contact Fair  Facial Expression Animated  Affect Anxious  Speech Logical/coherent  Interaction Assertive  Motor Activity Slow  Appearance/Hygiene Unremarkable  Behavior Characteristics Cooperative  Mood Anxious;Pleasant  Thought Process  Coherency WDL  Content WDL  Delusions None reported or observed  Perception WDL  Hallucination None reported or observed  Judgment Poor  Confusion None  Danger to Self  Current suicidal ideation? Denies  Agreement Not to Harm Self Yes  Description of Agreement Verbal  Danger to Others  Danger to Others None reported or observed   Russell Kennedy states that he has some anxiety r/t to his discharge tomorrow. He states that he looks forward to moving on. HTN and tachycardia monitored by treatment team, and medications adjusted and administered without issue.

## 2023-10-20 NOTE — [facility] Suicide Risk Assessment (Signed)
 CSW made contact with pt's mother to confirm discharge planning Francie Floss, [phone]). Per the mother, pt is still confirmed for admission to tx program in [place], [place] tomorrow. Pt's mother expressed no concerns and is aware the pt must be prepared for d/c by 10am tomorrow. CSW updated provider.

## 2023-10-20 NOTE — Progress Notes (Signed)
 [facility] MD Progress Note  10/20/2023 2:49 PM Russell Kennedy  MRN:  [id number]   Subjective:  Chart reviewed, case discussed in multidisciplinary meeting, patient seen during rounds.   9/27: Patient seen for follow-up they are alert and oriented.  They are pleasant and cooperative.  They rate depression at 0 10 in the rate anxiety 1 out of 10.  They are future oriented they are linear.  They are optimistic and request to be discharged to go to the program in [place] tomorrow.  Discussed ongoing tachycardia and how they are currently following with hospitalist and they agreed to follow-up with PCP continue taking their medications.  Continue to request discharge tomorrow.  Discussed case with mother advised the patient to follow-up with PCP and she acknowledged.  Patient is medication compliant has not required behavioral PRNs.  He denies SI, HI, and AVH.  He is performing his ADLs.  He reports stable mood appetite and sleep.  He voices no concerns or complaints at this time.   9/26: Patient seen for follow up. They are alert and oriented. They are pleasant and cooperative on exam. Rate depression 0/10 and anxiety 1/10. They are future oriented. Noted that since starting the new medication yesterday they have not felt their heart racing which has helped their anxiety. Sleep was improved last night. They are performing ADLS.    9/25: Patient seen for follow up. They are alert and oriented. They are pleasant and cooperative on exam. Rate depression 0/10 and anxiety 1/10. Discussed coping skills and they indicate music helps them handle stress. Discuss support system and they identify their mother and girlfriend. They are linear, logical, and future oriented. They deny SI/HI/AVH. Discussed that they are looking at a rehab center in [place] because the [place]  facility will not allow them to take psychotropic medications. They discussed the plan to go to the center in [place] with their mother, and we are planning a  family meeting to further discuss. Encouraged them to start participating in group and the unit milieu. They are performing Adls, and are having regular bowel movements.   9/24: Patient seen for follow up, they are found laying in bed. They are alert and oriented on exam. Notes that they got anxious last night due to having difficulty getting comfortable and received prn zyprexa . They report they were able to sleep well last night and that has significantly helped with their anxiety. They rate depression at 3/10 with primary sx being fatigue. They rate anxiety at 1/10. They discuss their mother and girlfriend as their support system. They deny si/hi/avh. They are linear on exam. They are future oriented continuing to discuss their plan to go to treatment. Encouraged them to start participating in group and the unit milieu. They are performing Adls, and are having regular bowel movements.     Sleep: improving  Appetite:  Good  Past Psychiatric History: see h&P Family History: History reviewed. No pertinent family history. Social History:  Social History   Substance and Sexual Activity  Alcohol Use Not Currently     Social History   Substance and Sexual Activity  Drug Use Yes   Comment: heroin, fentanyl     Social History   Socioeconomic History   Marital status: Single    Spouse name: Not on file   Number of children: Not on file   Years of education: Not on file   Highest education level: Not on file  Occupational History   Not on file  Tobacco Use  Smoking status: Every Day    Types: Cigarettes   Smokeless tobacco: Never  Vaping Use   Vaping status: Never Used  Substance and Sexual Activity   Alcohol use: Not Currently   Drug use: Yes    Comment: heroin, fentanyl    Sexual activity: Yes  Other Topics Concern   Not on file  Social History Narrative   Not on file   Social Drivers of Health   Financial Resource Strain: Not on file  Food Insecurity: No Food Insecurity  (10/15/2023)   Hunger Vital Sign    Worried About Running Out of Food in the Last Year: Never true    Ran Out of Food in the Last Year: Never true  Transportation Needs: No Transportation Needs (10/15/2023)   PRAPARE - Transportation    Lack of Transportation (Medical): No    Lack of Transportation (Non-Medical): No  Physical Activity: Not on file  Stress: Not on file  Social Connections: Not on file   Past Medical History:  Past Medical History:  Diagnosis Date   Polysubstance abuse (HCC)     Past Surgical History:  Procedure Laterality Date   ROTATOR CUFF REPAIR      Current Medications: Current Facility-Administered Medications  Medication Dose Route Frequency Provider Last Rate Last Admin   acetaminophen  (TYLENOL ) tablet 650 mg  650 mg Oral Q6H PRN Smith, Annie B, NP   650 mg at 10/20/23 0815   alum & mag hydroxide-simeth (MAALOX/MYLANTA) 200-200-20 MG/5ML suspension 30 mL  30 mL Oral Q4H PRN Smith, Annie B, NP       diltiazem  (CARDIZEM  CD) 24 hr capsule 240 mg  240 mg Oral Daily Josette Ade, MD   240 mg at 10/20/23 9187   haloperidol  (HALDOL ) tablet 5 mg  5 mg Oral TID PRN McLauchlin, Angela, NP       And   diphenhydrAMINE  (BENADRYL ) capsule 50 mg  50 mg Oral TID PRN McLauchlin, Angela, NP   50 mg at 10/18/23 2103   haloperidol  lactate (HALDOL ) injection 5 mg  5 mg Intramuscular TID PRN McLauchlin, Jon, NP       And   diphenhydrAMINE  (BENADRYL ) injection 50 mg  50 mg Intramuscular TID PRN McLauchlin, Jon, NP       And   LORazepam  (ATIVAN ) injection 2 mg  2 mg Intramuscular TID PRN McLauchlin, Angela, NP   2 mg at 10/17/23 0112   haloperidol  lactate (HALDOL ) injection 10 mg  10 mg Intramuscular TID PRN McLauchlin, Jon, NP       And   diphenhydrAMINE  (BENADRYL ) injection 50 mg  50 mg Intramuscular TID PRN McLauchlin, Jon, NP       And   LORazepam  (ATIVAN ) injection 2 mg  2 mg Intramuscular TID PRN McLauchlin, Angela, NP       FLUoxetine  (PROZAC ) capsule 10  mg  10 mg Oral Daily Rai Severns E, PA-C   10 mg at 10/20/23 9188   hydrOXYzine  (ATARAX ) tablet 50 mg  50 mg Oral TID PRN Wassim Kirksey E, PA-C   50 mg at 10/20/23 1133   labetalol  (NORMODYNE ) tablet 300 mg  300 mg Oral TID Josette Ade, MD   300 mg at 10/20/23 1133   magnesium  hydroxide (MILK OF MAGNESIA) suspension 30 mL  30 mL Oral Daily PRN Smith, Annie B, NP       nicotine  (NICODERM CQ  - dosed in mg/24 hours) patch 21 mg  21 mg Transdermal Daily Karmello Abercrombie E, PA-C   21 mg at  10/20/23 9187   nicotine  polacrilex (NICORETTE ) gum 2 mg  2 mg Oral PRN Doxie Augenstein E, PA-C   2 mg at 10/20/23 9057   OLANZapine  (ZYPREXA ) injection 10 mg  10 mg Intramuscular TID PRN McLauchlin, Jon, NP       OLANZapine  (ZYPREXA ) tablet 10 mg  10 mg Oral QHS Elinore Shults E, PA-C   10 mg at 10/19/23 2110   OLANZapine  zydis (ZYPREXA ) disintegrating tablet 5 mg  5 mg Oral TID PRN Smith, Annie B, NP   5 mg at 10/16/23 1101   traZODone  (DESYREL ) tablet 100 mg  100 mg Oral QHS PRN Debbie Russell BRAVO, PA-C   100 mg at 10/19/23 2109    Lab Results:  Results for orders placed or performed during the hospital encounter of 10/15/23 (from the past 48 hours)  CBC     Status: Abnormal   Collection Time: 10/19/23  6:55 AM  Result Value Ref Range   WBC 10.5 4.0 - 10.5 K/uL   RBC 4.17 (L) 4.22 - 5.81 MIL/uL   Hemoglobin 11.5 (L) 13.0 - 17.0 g/dL   HCT 65.3 (L) 60.9 - 47.9 %   MCV 83.0 80.0 - 100.0 fL   MCH 27.6 26.0 - 34.0 pg   MCHC 33.2 30.0 - 36.0 g/dL   RDW 86.5 88.4 - 84.4 %   Platelets 336 150 - 400 K/uL   nRBC 0.0 0.0 - 0.2 %    Comment: Performed at [facility], [address] [place], [place] [address]  Basic metabolic panel with GFR     Status: Abnormal   Collection Time: 10/19/23  6:55 AM  Result Value Ref Range   Sodium 141 135 - 145 mmol/L   Potassium 3.7 3.5 - 5.1 mmol/L   Chloride 103 98 - 111 mmol/L   CO2 22 22 - 32 mmol/L   Glucose, Bld 110 (H) 70  - 99 mg/dL    Comment: Glucose reference range applies only to samples taken after fasting for at least 8 hours.   BUN 13 6 - 20 mg/dL   Creatinine, Ser 9.32 0.61 - 1.24 mg/dL   Calcium 9.3 8.9 - 89.6 mg/dL   GFR, Estimated >39 >39 mL/min    Comment: (NOTE) Calculated using the CKD-EPI Creatinine Equation (2021)    Anion gap 16 (H) 5 - 15    Comment: Performed at [facility], [address] [place], [place] [address]     Blood Alcohol level:  Lab Results  Component Value Date   [facility] <15 10/05/2023   ETH <15 10/02/2023    Metabolic Disorder Labs: No results found for: HGBA1C, MPG No results found for: PROLACTIN Lab Results  Component Value Date   TRIG 257 (H) 05/15/2015    Physical Findings: AIMS:  , ,  ,  ,    CIWA:    COWS:      Psychiatric Specialty Exam:  Presentation  General Appearance:  Casual  Eye Contact: Fair  Speech: Clear and Coherent  Speech Volume: Normal    Mood and Affect  Mood: Euthymic  Affect: Congruent   Thought Process  Thought Processes: Goal Directed; Coherent  Descriptions of Associations:Intact  Orientation:Full (Time, Place and Person)  Thought Content:WDL  Hallucinations:No data recorded Ideas of Reference:None  Suicidal Thoughts:No data recorded Homicidal Thoughts:No data recorded  Sensorium  Memory: Immediate Fair  Judgment: Poor  Insight: Shallow   Executive Functions  Concentration: Fair  Attention Span: Fair  Recall: [employer]  of Knowledge: Fair  Language: Fair   Lexicographer Activity:No data recorded Musculoskeletal: Strength & Muscle Tone: within normal limits Gait & Station: normal Assets  Assets: Manufacturing systems engineer; Desire for Improvement    Physical Exam: Physical Exam Vitals and nursing note reviewed.  HENT:     Head: Atraumatic.  Eyes:     Extraocular Movements: Extraocular movements intact.  Pulmonary:     Effort:  Pulmonary effort is normal.  Neurological:     Mental Status: He is alert and oriented to person, place, and time.    Review of Systems  Psychiatric/Behavioral:  Negative for depression, hallucinations, substance abuse and suicidal ideas. The patient is nervous/anxious. The patient does not have insomnia.    Blood pressure (!) 131/101, pulse (!) 127, temperature (!) 97.5 F (36.4 C), temperature source Oral, resp. rate 18, height 6' 5 (1.956 m), weight 115.2 kg, SpO2 99%. Body mass index is 30.12 kg/m.  Diagnosis: Principal Problem:   Sinus tachycardia Active Problems:   Substance induced mood disorder (HCC)   Essential hypertension   History of alcohol abuse   PLAN: Safety and Monitoring:  -- Voluntary admission to inpatient psychiatric unit for safety, stabilization and treatment  -- Daily contact with patient to assess and evaluate symptoms and progress in treatment  -- Patient's case to be discussed in multi-disciplinary team meeting  -- Observation Level : q15 minute checks  -- Vital signs:  q12 hours  -- Precautions: suicide, elopement, and assault -- Encouraged patient to participate in unit milieu and in scheduled group therapies  2. Psychiatric Diagnoses and Treatment:  Substance-induced mood disorder Continue Zyprexa  to 10 mg nightly for sleep and mood stabilization Continue  Prozac  10 mg daily for anxiety and depression       -- The risks/benefits/side-effects/alternatives to this medication were discussed in detail with the patient and time was given for questions. The patient consents to medication trial.                -- Metabolic profile and EKG monitoring obtained while on an atypical antipsychotic (BMI: Lipid Panel: HbgA1c: QTc:)              -- Encouraged patient to participate in unit milieu and in scheduled group therapies                            3. Medical Issues Being Addressed:  Hospitalist  following     4. Discharge Planning:   --  Discharge tomorrow  -- Social work and case management to assist with discharge planning and identification of hospital follow-up needs prior to discharge  -- Estimated LOS: 3-4 days  Russell Kennedy Right, PA-C 10/20/2023, 2:49 PM

## 2023-10-20 NOTE — Progress Notes (Signed)
 Pharmacy Methadone Dose verification  10/20/23: Called pts; Methadone Clinic: [facility] [phone].  Per Rollo Finder LPN, patient last received Methadone on 10/04/23. Methadone dose was 100 mg once daily.    Allean Haas PharmD Clinical Pharmacist 10/20/2023

## 2023-10-20 NOTE — Progress Notes (Signed)
   10/20/23 2300  Psych Admission Type (Psych Patients Only)  Admission Status Involuntary  Psychosocial Assessment  Patient Complaints Substance abuse  Eye Contact Fair  Facial Expression Animated  Affect Anxious  Speech Logical/coherent  Interaction Assertive  Motor Activity Slow  Appearance/Hygiene Unremarkable  Behavior Characteristics Cooperative  Mood Anxious;Pleasant  Thought Process  Coherency WDL  Content WDL  Delusions None reported or observed  Perception WDL  Hallucination None reported or observed  Judgment Limited  Confusion None  Danger to Self  Current suicidal ideation? Denies  Agreement Not to Harm Self Yes  Description of Agreement verbal  Danger to Others  Danger to Others None reported or observed

## 2023-10-20 NOTE — Plan of Care (Signed)
  Problem: Education: Goal: Emotional status will improve Outcome: Progressing   Problem: Education: Goal: Mental status will improve Outcome: Progressing   Problem: Coping: Goal: Ability to verbalize frustrations and anger appropriately will improve Outcome: Progressing   Problem: Coping: Goal: Ability to demonstrate self-control will improve Outcome: Progressing   Problem: Elimination: Goal: Will not experience complications related to bowel motility Outcome: Progressing   Problem: Elimination: Goal: Will not experience complications related to urinary retention Outcome: Progressing

## 2023-10-20 NOTE — Plan of Care (Signed)
 Jonatha Gagen is a 33 y.o. male patient. No diagnosis found. Past Medical History:  Diagnosis Date   Polysubstance abuse (HCC)    Current Facility-Administered Medications  Medication Dose Route Frequency Provider Last Rate Last Admin   acetaminophen  (TYLENOL ) tablet 650 mg  650 mg Oral Q6H PRN Smith, Annie B, NP   650 mg at 10/18/23 1525   alum & mag hydroxide-simeth (MAALOX/MYLANTA) 200-200-20 MG/5ML suspension 30 mL  30 mL Oral Q4H PRN Smith, Annie B, NP       diltiazem  (CARDIZEM  CD) 24 hr capsule 120 mg  120 mg Oral Daily Shah, Vipul, MD   120 mg at 10/19/23 9180   haloperidol  (HALDOL ) tablet 5 mg  5 mg Oral TID PRN McLauchlin, Angela, NP       And   diphenhydrAMINE  (BENADRYL ) capsule 50 mg  50 mg Oral TID PRN McLauchlin, Angela, NP   50 mg at 10/18/23 2103   haloperidol  lactate (HALDOL ) injection 5 mg  5 mg Intramuscular TID PRN McLauchlin, Angela, NP       And   diphenhydrAMINE  (BENADRYL ) injection 50 mg  50 mg Intramuscular TID PRN McLauchlin, Angela, NP       And   LORazepam  (ATIVAN ) injection 2 mg  2 mg Intramuscular TID PRN McLauchlin, Angela, NP   2 mg at 10/17/23 0112   haloperidol  lactate (HALDOL ) injection 10 mg  10 mg Intramuscular TID PRN McLauchlin, Jon, NP       And   diphenhydrAMINE  (BENADRYL ) injection 50 mg  50 mg Intramuscular TID PRN McLauchlin, Angela, NP       And   LORazepam  (ATIVAN ) injection 2 mg  2 mg Intramuscular TID PRN McLauchlin, Angela, NP       FLUoxetine  (PROZAC ) capsule 10 mg  10 mg Oral Daily Millington, Matthew E, PA-C   10 mg at 10/19/23 0818   hydrOXYzine  (ATARAX ) tablet 50 mg  50 mg Oral TID PRN Millington, Matthew E, PA-C   50 mg at 10/19/23 2357   labetalol  (NORMODYNE ) tablet 300 mg  300 mg Oral BID Josette Ade, MD       magnesium  hydroxide (MILK OF MAGNESIA) suspension 30 mL  30 mL Oral Daily PRN Smith, Annie B, NP       nicotine  (NICODERM CQ  - dosed in mg/24 hours) patch 21 mg  21 mg Transdermal Daily Millington, Matthew E, PA-C   21  mg at 10/19/23 0818   nicotine  polacrilex (NICORETTE ) gum 2 mg  2 mg Oral PRN Millington, Matthew E, PA-C   2 mg at 10/19/23 1710   OLANZapine  (ZYPREXA ) injection 10 mg  10 mg Intramuscular TID PRN McLauchlin, Angela, NP       OLANZapine  (ZYPREXA ) tablet 10 mg  10 mg Oral QHS Millington, Matthew E, PA-C   10 mg at 10/19/23 2110   OLANZapine  zydis (ZYPREXA ) disintegrating tablet 5 mg  5 mg Oral TID PRN Smith, Annie B, NP   5 mg at 10/16/23 1101   traZODone  (DESYREL ) tablet 100 mg  100 mg Oral QHS PRN Millington, Matthew E, PA-C   100 mg at 10/19/23 2109   Not on File Principal Problem:   Sinus tachycardia Active Problems:   Substance induced mood disorder (HCC)   Essential hypertension   History of alcohol abuse  Blood pressure (!) 139/94, pulse (!) 102, temperature (!) 97.5 F (36.4 C), temperature source Oral, resp. rate 20, height 6' 5 (1.956 m), weight 115.2 kg, SpO2 98%.  Subjective Objective Assessment & Plan  Raef Sprigg B Doyel Mulkern 10/20/2023

## 2023-10-20 NOTE — Progress Notes (Signed)
  Progress Note   Patient: Russell Kennedy FMW:[id number] DOB: 09/04/1990 DOA: 10/15/2023     5 DOS: the patient was seen and examined on 10/20/2023   Brief hospital course: 33 y.o. male with medical history significant for Polysubstance abuse including opioid use disorder as well as alcohol use disorder who was under medical service 9/12-9/22 for withdrawal and was IVCed. He was DCed/transferred to [facility] on 9/23. We're consulted for Tachycardia.   9/26.  Patient feels a little shaky and a little fatigued.  He states he uses fentanyl  and methadone as outpatient.  Has been off pain meds for 9 days currently.  Case discussed with psychiatric team and next placed a plan on sending him to wants him off pain meds.  Assessment and Plan: * Sinus tachycardia Likely secondary to opioid withdrawal.  Increase Cardizem  CD to 240 mg daily.  Continue labetalol  300 mg 3 times daily.  Heart rate improved this afternoon from this morning.  Continue to monitor.  Essential hypertension Cardizem  CD increased to 240 mg daily.  Labetalol  back to 300 mg 3 times daily.  Restart lisinopril  10 mg daily.  History of alcohol abuse .  Substance induced mood disorder (HCC) Patient currently on Prozac , trazodone  and Zyprexa .  As needed medications also ordered.  Treatment as per psychiatry team.  The next place that they planned on sending him once some of the pain medications so we will not restart any pain medications.        Subjective: Patient felt okay with heart rate up at 130 this morning.  I increase Cardizem  CD up to 240 mg daily.  Also changed labetalol  back to 3 times daily dosing.  Physical Exam: Vitals:   10/20/23 1133 10/20/23 1137 10/20/23 1400 10/20/23 1610  BP: (!) 131/101 (!) 131/101 (!) 138/94 (!) 141/99  Pulse: (!) 127 (!) 127 (!) 104 (!) 102  Resp:  18 16   Temp:      TempSrc:      SpO2:  99% 99% 100%  Weight:      Height:       Physical Exam HENT:     Head: Normocephalic.      Mouth/Throat:     Pharynx: No oropharyngeal exudate.  Eyes:     General: Lids are normal.     Conjunctiva/sclera: Conjunctivae normal.  Cardiovascular:     Rate and Rhythm: Regular rhythm. Tachycardia present.     Heart sounds: Normal heart sounds, S1 normal and S2 normal.  Pulmonary:     Breath sounds: No decreased breath sounds, wheezing, rhonchi or rales.  Abdominal:     Palpations: Abdomen is soft.     Tenderness: There is no abdominal tenderness.  Musculoskeletal:     Right lower leg: No swelling.     Left lower leg: No swelling.  Skin:    General: Skin is warm.     Findings: No rash.  Neurological:     Mental Status: He is alert.     Data Reviewed: No new data  Family Communication: As per psychiatry  Disposition: Likely discharge tomorrow  Planned Discharge Destination: To another facility    Time spent: 28 minutes  Author: Charlie Patterson, MD 10/20/2023 4:12 PM  For on call review [email or web address].

## 2023-10-20 NOTE — Group Note (Signed)
 Date:  10/20/2023 Time:  12:49 PM  Group Topic/Focus:  Personal Choices and Values:   The focus of this group is to help patients assess and explore the importance of values in their lives, how their values affect their decisions, how they express their values and what opposes their expression.    Participation Level:  Active  Participation Quality:  Appropriate  Affect:  Appropriate  Cognitive:  Appropriate  Insight: Appropriate  Engagement in Group:  Engaged  Modes of Intervention:  Discussion, Education, and Support  Additional Comments:    Deitra Caron Mainland 10/20/2023, 12:49 PM

## 2023-10-20 NOTE — Progress Notes (Signed)
  [facility] Adult Case Management Discharge Plan :  Will you be returning to the same living situation after discharge:  No. At discharge, do you have transportation home?: Yes,  Patient's mother will transport Do you have the ability to pay for your medications: Yes,  no concerns verbalized by patient.  Release of information consent forms completed and in the chart;  Patient's signature needed at discharge.  Patient to Follow up at: For Primary Care: [facility]             [address]             [place], [place] [address]   Next level of care provider has access to [facility] [place], [phone]   Safety Planning and Suicide Prevention discussed: Yes,  SPE completed with patient's mother.   Have you used any form of tobacco in the last 30 days? (Cigarettes, Smokeless Tobacco, Cigars, and/or Pipes): Yes  Has patient been referred to the Quitline?: Patient refused referral for treatment  Patient has been referred for addiction treatment: Yes, referral information given but appointment not made [facility] [place], [phone] (list facility).  Aldo HERO Jalonda Antigua, LCSW 10/20/2023, 3:00 PM

## 2023-10-20 NOTE — Group Note (Signed)
 Date:  10/20/2023 Time:  11:54 PM  Group Topic/Focus:  Dimensions of Wellness:   The focus of this group is to introduce the topic of wellness and discuss the role each dimension of wellness plays in total health.    Participation Level:  Active  Participation Quality:  Appropriate  Affect:  Appropriate  Cognitive:  Appropriate  Insight: Appropriate  Engagement in Group:  Engaged  Modes of Intervention:  Education  Additional Comments:    Talal Fritchman L 10/20/2023, 11:54 PM

## 2023-10-21 DIAGNOSIS — R000 Tachycardia, unspecified: Secondary | ICD-10-CM | POA: Diagnosis not present

## 2023-10-21 DIAGNOSIS — Z8659 Personal history of other mental and behavioral disorders: Secondary | ICD-10-CM | POA: Diagnosis not present

## 2023-10-21 DIAGNOSIS — F1994 Other psychoactive substance use, unspecified with psychoactive substance-induced mood disorder: Secondary | ICD-10-CM | POA: Diagnosis not present

## 2023-10-21 DIAGNOSIS — I10 Essential (primary) hypertension: Secondary | ICD-10-CM | POA: Diagnosis not present

## 2023-10-21 DIAGNOSIS — F1011 Alcohol abuse, in remission: Secondary | ICD-10-CM | POA: Diagnosis not present

## 2023-10-21 MED ORDER — DILTIAZEM HCL ER COATED BEADS 240 MG PO CP24: 240.0000 mg | ORAL_CAPSULE | Freq: Every day | ORAL | 0 refills | Status: AC

## 2023-10-21 MED ORDER — HYDROXYZINE HCL 50 MG PO TABS: 50.0000 mg | ORAL_TABLET | Freq: Every day | ORAL | 0 refills | Status: DC | PRN

## 2023-10-21 MED ORDER — DILTIAZEM HCL ER COATED BEADS 240 MG PO CP24: 240.0000 mg | ORAL_CAPSULE | Freq: Every day | ORAL | 0 refills | Status: DC

## 2023-10-21 MED ORDER — LISINOPRIL 10 MG PO TABS: 10.0000 mg | ORAL_TABLET | Freq: Every day | ORAL | 0 refills | Status: AC

## 2023-10-21 MED ORDER — HYDROXYZINE HCL 50 MG PO TABS: 50.0000 mg | ORAL_TABLET | Freq: Every day | ORAL | 0 refills | Status: AC | PRN

## 2023-10-21 MED ORDER — LABETALOL HCL 300 MG PO TABS: 300.0000 mg | ORAL_TABLET | Freq: Three times a day (TID) | ORAL | 0 refills | Status: DC

## 2023-10-21 MED ORDER — OLANZAPINE 10 MG PO TABS: 10.0000 mg | ORAL_TABLET | Freq: Every day | ORAL | 0 refills | Status: DC

## 2023-10-21 MED ORDER — LABETALOL HCL 300 MG PO TABS: 300.0000 mg | ORAL_TABLET | Freq: Three times a day (TID) | ORAL | 0 refills | Status: AC

## 2023-10-21 MED ORDER — OLANZAPINE 10 MG PO TABS: 10.0000 mg | ORAL_TABLET | Freq: Every day | ORAL | 0 refills | Status: AC

## 2023-10-21 MED ORDER — FLUOXETINE HCL 10 MG PO CAPS: 10.0000 mg | ORAL_CAPSULE | Freq: Every day | ORAL | 0 refills | Status: DC

## 2023-10-21 MED ORDER — TRAZODONE HCL 100 MG PO TABS: 100.0000 mg | ORAL_TABLET | Freq: Every evening | ORAL | 0 refills | Status: DC | PRN

## 2023-10-21 MED ORDER — LISINOPRIL 10 MG PO TABS: 10.0000 mg | ORAL_TABLET | Freq: Every day | ORAL | 0 refills | Status: DC

## 2023-10-21 MED ORDER — FLUOXETINE HCL 10 MG PO CAPS: 10.0000 mg | ORAL_CAPSULE | Freq: Every day | ORAL | 0 refills | Status: AC

## 2023-10-21 MED ORDER — TRAZODONE HCL 100 MG PO TABS: 100.0000 mg | ORAL_TABLET | Freq: Every evening | ORAL | 0 refills | Status: AC | PRN

## 2023-10-21 NOTE — Discharge Summary (Signed)
 Physician Discharge Summary Note  Patient:  Russell Kennedy is an 33 y.o., male MRN:  [id number] DOB:  1990/11/06 Patient phone:  [phone] (home)  Patient address:   [address] [place] [address],   Total time spent: 40 min Date of Admission:  10/15/2023 Date of Discharge: 10/21/23  Reason for Admission:  Ronnell Clinger is a 33 y.o. male with a PMH significant for Polysubstance use including opioid use disorder and alcohol use disorder.  He was initially admitted to the medical floor for fever of unknown origin.  He was  believed to be intoxicated on multiple substances he was treated for acute withdrawal and then discharged to the psychiatric unit patient was admitted psychiatry with concerns for agitated aggressive behavior and hallucinations he was under IVC.  Principal Problem: Sinus tachycardia Discharge Diagnoses: Principal Problem:   Sinus tachycardia Active Problems:   Substance induced mood disorder (HCC)   Essential hypertension   History of alcohol abuse   Past Psychiatric History: See H&P  Family Psychiatric  History: See H&P Social History:  Social History   Substance and Sexual Activity  Alcohol Use Not Currently     Social History   Substance and Sexual Activity  Drug Use Yes   Comment: heroin, fentanyl     Social History   Socioeconomic History   Marital status: Single    Spouse name: Not on file   Number of children: Not on file   Years of education: Not on file   Highest education level: Not on file  Occupational History   Not on file  Tobacco Use   Smoking status: Every Day    Types: Cigarettes   Smokeless tobacco: Never  Vaping Use   Vaping status: Never Used  Substance and Sexual Activity   Alcohol use: Not Currently   Drug use: Yes    Comment: heroin, fentanyl    Sexual activity: Yes  Other Topics Concern   Not on file  Social History Narrative   Not on file   Social Drivers of Health   Financial Resource Strain: Not on file   Food Insecurity: No Food Insecurity (10/15/2023)   Hunger Vital Sign    Worried About Running Out of Food in the Last Year: Never true    Ran Out of Food in the Last Year: Never true  Transportation Needs: No Transportation Needs (10/15/2023)   PRAPARE - Administrator, Civil Service (Medical): No    Lack of Transportation (Non-Medical): No  Physical Activity: Not on file  Stress: Not on file  Social Connections: Not on file   Past Medical History:  Past Medical History:  Diagnosis Date   Polysubstance abuse (HCC)     Past Surgical History:  Procedure Laterality Date   ROTATOR CUFF REPAIR     Family History: History reviewed. No pertinent family history.  Hospital Course:    On medical admission, he was intermittently disoriented with altered mental status, garbled speech, and poor engagement, and demonstrated threatening stares though was redirectable. Collateral history from family indicated recent fentanyl  and alprazolam relapse after several years of sobriety, associated with paranoia, aggression, and possible seizure activity.  Following being cleared medically he was transferred for psychiatric admission.  During inpatient psychiatric treatment, the patient was started on Prozac  10 mg daily for anxiety and depression and Zyprexa  10 mg nightly for mood stabilization and sleep, with good tolerance and no adverse effects. He was encouraged to participate in group therapies and the unit milieu, and demonstrated  increasing engagement with care. He consistently denied suicidal ideation, homicidal ideation, and hallucinations of all modalities as treatment progressed. His insight into his substance use and need for rehabilitation improved, and he actively participated in discharge planning with his mother, who is pursuing emergency guardianship and arranging residential placement.  By 9/27, he was alert, oriented, pleasant, cooperative, future-oriented, medication-compliant,  performing ADLs independently, and denying SI/HI/AVH. He voiced optimism about rehabilitation and requested discharge to [facility] for continued residential treatment. Blood pressure and tachycardia were monitored throughout hospitalization, and hospitalist was consulted for management pt was discharged with meds per the hospitalist recommendation he was counseled to follow up with his PCP for ongoing management. A family meeting was conducted to determine disposition as pt had initially plan to go to treatment center in [place]  but family was in agreement to send to [facility] as they had medical staff on site and allowed for psychotropic medications.  Patient discussed that he plan to go through the rehab program but that he may want to get back on methadone or Suboxone  following treatment.  He demonstrated good insight into the need for outpatient follow-up and medication compliance.  He was discharged to family care for taking him home to get close and then to the [facility] for residential treatment.  Detailed risk assessment is complete based on clinical exam and individual risk factors and acute suicide risk is low and acute violence risk is low.     Currently, all modifiable risk of harm to self/harm to others have been addressed and patient is no longer appropriate for the acute inpatient setting and is able to continue treatment for mental health needs in the community with the supports as indicated below.  Patient is educated and verbalized understanding of discharge plan of care including medications, follow-up appointments, mental health resources and further crisis services in the community.  He is instructed to call 911 or present to the nearest emergency room should he experience any decompensation in mood, or  suicidal/homicidal ideations.  Patient verbalizes understanding of this education and agrees to this plan of care  Physical Findings: AIMS:  , ,  ,  ,    CIWA:    COWS:         Psychiatric Specialty Exam:  Presentation  General Appearance:  Casual  Eye Contact: Fair  Speech: Clear and Coherent  Speech Volume: Normal    Mood and Affect  Mood: Euthymic  Affect: Congruent   Thought Process  Thought Processes: Coherent  Descriptions of Associations:Intact  Orientation:Full (Time, Place and Person)  Thought Content:WDL  Hallucinations:Hallucinations: None  Ideas of Reference:None  Suicidal Thoughts:Suicidal Thoughts: No  Homicidal Thoughts:Homicidal Thoughts: No   Sensorium  Memory: Immediate Fair; Recent Fair  Judgment: Fair  Insight: Fair   Art therapist  Concentration: Fair  Attention Span: Fair  Recall: [employer] of Knowledge: Fair  Language: Fair   Psychomotor Activity  Psychomotor Activity: Psychomotor Activity: Normal  Musculoskeletal: Strength & Muscle Tone: within normal limits Gait & Station: normal Assets  Assets: Manufacturing systems engineer; Desire for Improvement   Sleep  Sleep: Sleep: Good    Physical Exam: Physical Exam Vitals and nursing note reviewed.  HENT:     Head: Atraumatic.  Eyes:     Extraocular Movements: Extraocular movements intact.  Pulmonary:     Effort: Pulmonary effort is normal.  Neurological:     Mental Status: He is alert and oriented to person, place, and time.  Psychiatric:  Mood and Affect: Mood normal.        Behavior: Behavior normal.        Thought Content: Thought content normal.    Review of Systems  Psychiatric/Behavioral:  Negative for depression, hallucinations, substance abuse and suicidal ideas. The patient is nervous/anxious. The patient does not have insomnia.    Blood pressure 118/84, pulse 94, temperature 98.1 F (36.7 C), resp. rate 17, height 6' 5 (1.956 m), weight 115.2 kg, SpO2 99%. Body mass index is 30.12 kg/m.   Social History   Tobacco Use  Smoking Status Every Day   Types: Cigarettes  Smokeless  Tobacco Never   Tobacco Cessation:  N/A, patient does not currently use tobacco products   Blood Alcohol level:  Lab Results  Component Value Date   [facility] <15 10/05/2023   ETH <15 10/02/2023    Metabolic Disorder Labs:  No results found for: HGBA1C, MPG No results found for: PROLACTIN Lab Results  Component Value Date   TRIG 257 (H) 05/15/2015    See Psychiatric Specialty Exam and Suicide Risk Assessment completed by Attending Physician prior to discharge.  Discharge destination:  Other:  Residential treatment  Is patient on multiple antipsychotic therapies at discharge:  No   Has Patient had three or more failed trials of antipsychotic monotherapy by history:  No  Recommended Plan for Multiple Antipsychotic Therapies: NA   Allergies as of 10/21/2023   Not on File      Medication List     STOP taking these medications    acetaminophen  325 MG tablet Commonly known as: TYLENOL    amLODipine  10 MG tablet Commonly known as: NORVASC    cloNIDine  0.2 mg/24hr patch Commonly known as: CATAPRES  - Dosed in mg/24 hr       TAKE these medications      Indication  diltiazem  240 MG 24 hr capsule Commonly known as: CARDIZEM  CD Take 1 capsule (240 mg total) by mouth daily. Start taking on: October 22, 2023  Indication: High Blood Pressure   FLUoxetine  10 MG capsule Commonly known as: PROZAC  Take 1 capsule (10 mg total) by mouth daily. Start taking on: October 22, 2023  Indication: anxiety   hydrOXYzine  50 MG tablet Commonly known as: ATARAX  Take 1 tablet (50 mg total) by mouth daily as needed for anxiety.  Indication: Feeling Anxious   labetalol  300 MG tablet Commonly known as: NORMODYNE  Take 1 tablet (300 mg total) by mouth every 8 (eight) hours. What changed: when to take this  Indication: High Blood Pressure   lisinopril  10 MG tablet Commonly known as: ZESTRIL  Take 1 tablet (10 mg total) by mouth daily.  Indication: High Blood Pressure    OLANZapine  10 MG tablet Commonly known as: ZYPREXA  Take 1 tablet (10 mg total) by mouth at bedtime. What changed:  medication strength how much to take  Indication: Mood disorder   traZODone  100 MG tablet Commonly known as: DESYREL  Take 1 tablet (100 mg total) by mouth at bedtime as needed for sleep.  Indication: Trouble Sleeping        Follow-up Information     [facility] [place] Follow up in 1 day(s).   Contact information: [address] [place], [place] [address] [facility], [phone]        [facility]. Schedule an appointment as soon as possible for a visit.   Why: As needed Contact information: [address] [place] [address] [phone]  PCP. Schedule an appointment as soon as possible for a visit today.   Why: Establish with a primary care doctor to follow up for medical concerns (pulse and blood pressure control) per request of psychiatric providers                Follow-up recommendations:    # It is recommended to the patient to continue psychiatric medications as prescribed, after discharge from the hospital.   # It is recommended to the patient to follow up with your outpatient psychiatric provider and PCP. # It was discussed with the patient, the impact of alcohol, drugs, tobacco have been there overall psychiatric and medical wellbeing, and total abstinence from substance use was recommended. # Prescriptions provided or sent directly to preferred pharmacy at discharge. Patient agreeable to plan. Given the opportunity to ask questions. Appears to feel comfortable with discharge.  # In the event of worsening symptoms, the patient is instructed to call the crisis hotline (988), 911 and or go to the nearest ED for appropriate evaluation and treatment of symptoms. To follow-up with primary care provider for other medical issues, concerns and or health care needs # Patient was discharged home as requested with a  plan to follow up as noted above.      Signed: Donnice FORBES Right, PA-C 10/21/2023, 4:32 PM

## 2023-10-21 NOTE — Progress Notes (Signed)
  [facility] Adult Case Management Discharge Plan :  Will you be returning to the same living situation after discharge:  Yes,  pt will return home  At discharge, do you have transportation home?: Yes,  pt's mom will pick him up  Do you have the ability to pay for your medications: Yes,  [employer]  Release of information consent forms completed and in the chart;  Patient's signature needed at discharge.  Patient to Follow up at:  Follow-up Information     [facility] [place] Follow up in 1 day(s).   Contact information: [address] [place], [place] [address] [facility] [place], [phone]        [facility]. Schedule an appointment as soon as possible for a visit.   Why: As needed Contact information: [address] [place] [address] [phone]         PCP. Schedule an appointment as soon as possible for a visit today.   Why: Establish with a primary care doctor to follow up for medical concerns (pulse and blood pressure control) per request of psychiatric providers                Next level of care provider has access to [facility] Link:no  Safety Planning and Suicide Prevention discussed: Yes,  Leita Ricks, mother, [phone]  Have you used any form of tobacco in the last 30 days? (Cigarettes, Smokeless Tobacco, Cigars, and/or Pipes): Yes  Has patient been referred to the Quitline?: Patient refused referral for treatment  Patient has been referred for addiction treatment: Yes, the patient will follow up with an outpatient provider for substance use disorder. Psychiatrist/APP: appointment made  Lum JONETTA Croft, LCSWA 10/21/2023, 9:18 AM

## 2023-10-21 NOTE — Plan of Care (Signed)
  Problem: Education: Goal: Knowledge of [facility] Education information/materials will improve Outcome: Adequate for Discharge Goal: Emotional status will improve Outcome: Adequate for Discharge Goal: Mental status will improve Outcome: Adequate for Discharge Goal: Verbalization of understanding the information provided will improve Outcome: Adequate for Discharge   Problem: Activity: Goal: Interest or engagement in activities will improve Outcome: Adequate for Discharge Goal: Sleeping patterns will improve Outcome: Adequate for Discharge   Problem: Coping: Goal: Ability to verbalize frustrations and anger appropriately will improve Outcome: Adequate for Discharge Goal: Ability to demonstrate self-control will improve Outcome: Adequate for Discharge   Problem: Safety: Goal: Ability to remain free from injury will improve Outcome: Adequate for Discharge

## 2023-10-21 NOTE — Plan of Care (Signed)
 Problem: Education: Goal: Knowledge of [facility] Education information/materials will improve 10/21/2023 0818 by Nelwyn Eleanor BROCKS, RN Outcome: Adequate for Discharge 10/21/2023 0818 by Nelwyn Eleanor BROCKS, RN Outcome: Adequate for Discharge Goal: Emotional status will improve 10/21/2023 0818 by Nelwyn Eleanor BROCKS, RN Outcome: Adequate for Discharge 10/21/2023 0818 by Nelwyn Eleanor BROCKS, RN Outcome: Adequate for Discharge Goal: Mental status will improve 10/21/2023 0818 by Nelwyn Eleanor BROCKS, RN Outcome: Adequate for Discharge 10/21/2023 0818 by Nelwyn Eleanor BROCKS, RN Outcome: Adequate for Discharge Goal: Verbalization of understanding the information provided will improve 10/21/2023 0818 by Nelwyn Eleanor BROCKS, RN Outcome: Adequate for Discharge 10/21/2023 0818 by Nelwyn Eleanor BROCKS, RN Outcome: Adequate for Discharge   Problem: Activity: Goal: Interest or engagement in activities will improve 10/21/2023 0818 by Nelwyn Eleanor BROCKS, RN Outcome: Adequate for Discharge 10/21/2023 0818 by Nelwyn Eleanor BROCKS, RN Outcome: Adequate for Discharge Goal: Sleeping patterns will improve 10/21/2023 0818 by Nelwyn Eleanor BROCKS, RN Outcome: Adequate for Discharge 10/21/2023 0818 by Nelwyn Eleanor BROCKS, RN Outcome: Adequate for Discharge   Problem: Coping: Goal: Ability to verbalize frustrations and anger appropriately will improve 10/21/2023 0818 by Nelwyn Eleanor BROCKS, RN Outcome: Adequate for Discharge 10/21/2023 0818 by Nelwyn Eleanor BROCKS, RN Outcome: Adequate for Discharge Goal: Ability to demonstrate self-control will improve 10/21/2023 0818 by Nelwyn Eleanor BROCKS, RN Outcome: Adequate for Discharge 10/21/2023 0818 by Nelwyn Eleanor BROCKS, RN Outcome: Adequate for Discharge   Problem: Health Behavior/Discharge Planning: Goal: Identification of resources available to assist in meeting health care needs will improve 10/21/2023 0818 by Nelwyn Eleanor BROCKS, RN Outcome: Adequate for  Discharge 10/21/2023 0818 by Nelwyn Eleanor BROCKS, RN Outcome: Adequate for Discharge Goal: Compliance with treatment plan for underlying cause of condition will improve 10/21/2023 0818 by Nelwyn Eleanor BROCKS, RN Outcome: Adequate for Discharge 10/21/2023 0818 by Nelwyn Eleanor BROCKS, RN Outcome: Adequate for Discharge   Problem: Physical Regulation: Goal: Ability to maintain clinical measurements within normal limits will improve 10/21/2023 0818 by Nelwyn Eleanor BROCKS, RN Outcome: Adequate for Discharge 10/21/2023 0818 by Nelwyn Eleanor BROCKS, RN Outcome: Adequate for Discharge   Problem: Safety: Goal: Periods of time without injury will increase 10/21/2023 0818 by Nelwyn Eleanor BROCKS, RN Outcome: Adequate for Discharge 10/21/2023 0818 by Nelwyn Eleanor BROCKS, RN Outcome: Adequate for Discharge   Problem: Education: Goal: Knowledge of General Education information will improve Description: Including pain rating scale, medication(s)/side effects and non-pharmacologic comfort measures 10/21/2023 0818 by Nelwyn Eleanor BROCKS, RN Outcome: Adequate for Discharge 10/21/2023 0818 by Nelwyn Eleanor BROCKS, RN Outcome: Adequate for Discharge   Problem: Health Behavior/Discharge Planning: Goal: Ability to manage health-related needs will improve 10/21/2023 0818 by Nelwyn Eleanor BROCKS, RN Outcome: Adequate for Discharge 10/21/2023 0818 by Nelwyn Eleanor BROCKS, RN Outcome: Adequate for Discharge   Problem: Clinical Measurements: Goal: Ability to maintain clinical measurements within normal limits will improve 10/21/2023 0818 by Nelwyn Eleanor BROCKS, RN Outcome: Adequate for Discharge 10/21/2023 0818 by Nelwyn Eleanor BROCKS, RN Outcome: Adequate for Discharge Goal: Will remain free from infection 10/21/2023 0818 by Nelwyn Eleanor BROCKS, RN Outcome: Adequate for Discharge 10/21/2023 0818 by Nelwyn Eleanor BROCKS, RN Outcome: Adequate for Discharge Goal: Diagnostic test results will improve 10/21/2023 0818 by Nelwyn Eleanor BROCKS, RN Outcome: Adequate for Discharge 10/21/2023 0818 by Nelwyn Eleanor BROCKS, RN Outcome: Adequate for Discharge Goal: Respiratory complications will improve 10/21/2023 0818 by Nelwyn Eleanor BROCKS, RN Outcome: Adequate for Discharge 10/21/2023 0818 by Nelwyn Eleanor BROCKS, RN Outcome: Adequate for Discharge Goal: Cardiovascular complication  will be avoided 10/21/2023 0818 by Nelwyn Eleanor BROCKS, RN Outcome: Adequate for Discharge 10/21/2023 0818 by Nelwyn Eleanor BROCKS, RN Outcome: Adequate for Discharge   Problem: Activity: Goal: Risk for activity intolerance will decrease 10/21/2023 0818 by Nelwyn Eleanor BROCKS, RN Outcome: Adequate for Discharge 10/21/2023 0818 by Nelwyn Eleanor BROCKS, RN Outcome: Adequate for Discharge   Problem: Nutrition: Goal: Adequate nutrition will be maintained 10/21/2023 0818 by Nelwyn Eleanor BROCKS, RN Outcome: Adequate for Discharge 10/21/2023 0818 by Nelwyn Eleanor BROCKS, RN Outcome: Adequate for Discharge   Problem: Coping: Goal: Level of anxiety will decrease 10/21/2023 0818 by Nelwyn Eleanor BROCKS, RN Outcome: Adequate for Discharge 10/21/2023 0818 by Nelwyn Eleanor BROCKS, RN Outcome: Adequate for Discharge   Problem: Elimination: Goal: Will not experience complications related to bowel motility 10/21/2023 0818 by Nelwyn Eleanor BROCKS, RN Outcome: Adequate for Discharge 10/21/2023 0818 by Nelwyn Eleanor BROCKS, RN Outcome: Adequate for Discharge Goal: Will not experience complications related to urinary retention 10/21/2023 0818 by Nelwyn Eleanor BROCKS, RN Outcome: Adequate for Discharge 10/21/2023 0818 by Nelwyn Eleanor BROCKS, RN Outcome: Adequate for Discharge   Problem: Pain Managment: Goal: General experience of comfort will improve and/or be controlled 10/21/2023 0818 by Nelwyn Eleanor BROCKS, RN Outcome: Adequate for Discharge 10/21/2023 0818 by Nelwyn Eleanor BROCKS, RN Outcome: Adequate for Discharge   Problem: Safety: Goal: Ability to remain free from injury  will improve 10/21/2023 0818 by Nelwyn Eleanor BROCKS, RN Outcome: Adequate for Discharge 10/21/2023 0818 by Nelwyn Eleanor BROCKS, RN Outcome: Adequate for Discharge   Problem: Skin Integrity: Goal: Risk for impaired skin integrity will decrease 10/21/2023 0818 by Nelwyn Eleanor BROCKS, RN Outcome: Adequate for Discharge 10/21/2023 0818 by Nelwyn Eleanor BROCKS, RN Outcome: Adequate for Discharge

## 2023-10-21 NOTE — [facility] Suicide Risk Assessment (Signed)
 [facility] Discharge Suicide Risk Assessment   Principal Problem: Sinus tachycardia Discharge Diagnoses: Principal Problem:   Sinus tachycardia Active Problems:   Substance induced mood disorder (HCC)   Essential hypertension   History of alcohol abuse   Total Time spent with patient: 30 minutes  Musculoskeletal: Strength & Muscle Tone: within normal limits Gait & Station: normal Patient leans: N/A  Psychiatric Specialty Exam  Presentation  General Appearance:  Casual  Eye Contact: Fair  Speech: Clear and Coherent  Speech Volume: Normal  Handedness: Right   Mood and Affect  Mood: Euthymic  Duration of Depression Symptoms: Greater than two weeks  Affect: Congruent   Thought Process  Thought Processes: Coherent  Descriptions of Associations:Intact  Orientation:Full (Time, Place and Person)  Thought Content:WDL  History of Schizophrenia/Schizoaffective disorder:No  Duration of Psychotic Symptoms:No data recorded Hallucinations:Hallucinations: None  Ideas of Reference:None  Suicidal Thoughts:Suicidal Thoughts: No  Homicidal Thoughts:Homicidal Thoughts: No   Sensorium  Memory: Immediate Fair; Recent Fair  Judgment: Fair  Insight: Fair   Art therapist  Concentration: Fair  Attention Span: Fair  Recall: [employer] of Knowledge: Fair  Language: Fair   Psychomotor Activity  Psychomotor Activity: Psychomotor Activity: Normal   Assets  Assets: Communication Skills; Desire for Improvement   Sleep  Sleep: Sleep: Good  Estimated Sleeping Duration (Last 24 Hours): 6.25-7.25 hours  Physical Exam: Physical Exam ROS Blood pressure (!) 137/101, pulse 95, temperature 98.1 F (36.7 C), resp. rate 17, height 6' 5 (1.956 m), weight 115.2 kg, SpO2 100%. Body mass index is 30.12 kg/m.  Mental Status Per Nursing Assessment::   On Admission:  NA  Demographic Factors:  Male, Caucasian, and Unemployed  Loss  Factors: NA  Historical Factors: Impulsivity  Risk Reduction Factors:   Positive social support  Continued Clinical Symptoms:  Alcohol/Substance Abuse/Dependencies Medical Diagnoses and Treatments/Surgeries  Cognitive Features That Contribute To Risk:  None    Suicide Risk:  Minimal: No identifiable suicidal ideation.  Patients presenting with no risk factors but with morbid ruminations; may be classified as minimal risk based on the severity of the depressive symptoms   Follow-up Information     [facility] [place] Follow up in 1 day(s).   Contact information: [address] [place], [place] [address] [facility] [place], [phone]        [facility], [facility]. Schedule an appointment as soon as possible for a visit.   Why: As needed Contact information: [address] [place] [address] [phone]                 Plan Of Care/Follow-up recommendations:  # It is recommended to the patient to continue psychiatric medications as prescribed, after discharge from the hospital.   # It is recommended to the patient to follow up with your outpatient psychiatric provider and PCP. # It was discussed with the patient, the impact of alcohol, drugs, tobacco have been there overall psychiatric and medical wellbeing, and total abstinence from substance use was recommended. # Prescriptions provided or sent directly to preferred pharmacy at discharge. Patient agreeable to plan. Given the opportunity to ask questions. Appears to feel comfortable with discharge.  # In the event of worsening symptoms, the patient is instructed to call the crisis hotline (988), 911 and or go to the nearest ED for appropriate evaluation and treatment of symptoms. To follow-up with primary care provider for other medical issues, concerns and or health care needs # Patient was discharged home as requested  with a plan to follow up as noted above.    Donnice FORBES Right, PA-C 10/21/2023, 8:58  AM

## 2023-10-21 NOTE — Progress Notes (Signed)
  Progress Note   Patient: Russell Kennedy FMW:[id number] DOB: 1990-06-14 DOA: 10/15/2023     6 DOS: the patient was seen and examined on 10/21/2023   Brief hospital course: 33 y.o. male with medical history significant for Polysubstance abuse including opioid use disorder as well as alcohol use disorder who was under medical service 9/12-9/22 for withdrawal and was IVCed. He was DCed/transferred to [facility] on 9/23. We're consulted for Tachycardia.   9/26.  Patient feels a little shaky and a little fatigued.  He states he uses fentanyl  and methadone as outpatient.  Has been off pain meds for 9 days currently.  Case discussed with psychiatric team and next placed a plan on sending him to wants him off pain meds. 9/27.  Cardizem  CD increased to 240 mg daily.  Labetalol  every 8 hours. 9/28.  Patient seen this morning.  Blood pressure high before morning meds but heart rate better.  Assessment and Plan: * Sinus tachycardia Likely secondary to opioid withdrawal.  Continue increased dose of Cardizem  CD to 240 mg daily.  Continue labetalol  300 mg 3 times daily.  Heart rate improved.  Essential hypertension Continue Cardizem  CD 240 mg daily, Labetalol  300 mg 3 times daily, and lisinopril  10 mg daily.  History of alcohol abuse .  Substance induced mood disorder (HCC) Patient currently on Prozac , trazodone  and Zyprexa .  As needed medications also ordered.  Treatment as per psychiatry team.  The next place that they planned on sending him once some of the pain medications so we will not restart any pain medications.        Subjective: Patient seen this morning was feeling okay.  He offers no complaints.  Heart rate has trended better.  Blood pressure high before morning meds.  Blood pressure came down after morning meds.  Physical Exam: Vitals:   10/20/23 2100 10/20/23 2205 10/21/23 0619 10/21/23 0905  BP: 123/80 114/78 (!) 137/101 118/84  Pulse:   95 94  Resp:   17   Temp:   98.1 F (36.7 C)    TempSrc:      SpO2:   100% 99%  Weight:      Height:       Physical Exam HENT:     Head: Normocephalic.     Mouth/Throat:     Pharynx: No oropharyngeal exudate.  Eyes:     General: Lids are normal.     Conjunctiva/sclera: Conjunctivae normal.  Cardiovascular:     Rate and Rhythm: Normal rate and regular rhythm.     Heart sounds: Normal heart sounds, S1 normal and S2 normal.  Pulmonary:     Breath sounds: No decreased breath sounds, wheezing, rhonchi or rales.  Abdominal:     Palpations: Abdomen is soft.     Tenderness: There is no abdominal tenderness.  Musculoskeletal:     Right lower leg: No swelling.     Left lower leg: No swelling.  Skin:    General: Skin is warm.     Findings: No rash.  Neurological:     Mental Status: He is alert.     Data Reviewed: No new data  Family Communication: As per psychiatry team  Disposition: As per psychiatry team  Planned Discharge Destination: Patient being transferred to a next facility    Time spent: 25 minutes  Author: Charlie Patterson, MD 10/21/2023 12:06 PM  For on call review [email or web address].

## 2023-10-21 NOTE — Progress Notes (Signed)
 Patient ID: Russell Kennedy, male   DOB: 1990/08/26, 33 y.o.   MRN: [id number]  Patient discharged with mother. Discharge paperwork provided with education. Verbalized understanding. Patient satisfied with property returned. Denies SI/HI/AVH and pain. No distress noted.
